# Patient Record
Sex: Female | Born: 1950 | Hispanic: No | Marital: Married | Smoking: Never smoker
Health system: Southern US, Community
[De-identification: ages and names within clinical notes are randomized; demographics above are authoritative.]

## PROBLEM LIST (undated history)

## (undated) DIAGNOSIS — N907 Vulvar cyst: Secondary | ICD-10-CM

## (undated) DIAGNOSIS — B9681 Helicobacter pylori [H. pylori] as the cause of diseases classified elsewhere: Secondary | ICD-10-CM

## (undated) DIAGNOSIS — E039 Hypothyroidism, unspecified: Secondary | ICD-10-CM

## (undated) DIAGNOSIS — Z842 Family history of other diseases of the genitourinary system: Secondary | ICD-10-CM

## (undated) DIAGNOSIS — M1990 Unspecified osteoarthritis, unspecified site: Secondary | ICD-10-CM

## (undated) DIAGNOSIS — C569 Malignant neoplasm of unspecified ovary: Secondary | ICD-10-CM

## (undated) DIAGNOSIS — K279 Peptic ulcer, site unspecified, unspecified as acute or chronic, without hemorrhage or perforation: Secondary | ICD-10-CM

## (undated) DIAGNOSIS — K633 Ulcer of intestine: Secondary | ICD-10-CM

## (undated) HISTORY — DX: Family history of other diseases of the genitourinary system: Z84.2

## (undated) HISTORY — PX: COLON SURGERY: SHX602

## (undated) HISTORY — DX: Unspecified osteoarthritis, unspecified site: M19.90

## (undated) HISTORY — PX: TOTAL ABDOMINAL HYSTERECTOMY W/ BILATERAL SALPINGOOPHORECTOMY: SHX83

## (undated) HISTORY — PX: FOOT SURGERY: SHX648

## (undated) HISTORY — DX: Malignant neoplasm of unspecified ovary: C56.9

## (undated) HISTORY — DX: Ulcer of intestine: K63.3

## (undated) HISTORY — PX: APPENDECTOMY: SHX54

## (undated) HISTORY — DX: Vulvar cyst: N90.7

---

## 2010-04-29 ENCOUNTER — Inpatient Hospital Stay: Payer: Self-pay | Admitting: Surgery

## 2012-11-13 ENCOUNTER — Emergency Department (HOSPITAL_COMMUNITY): Payer: Self-pay

## 2012-11-13 ENCOUNTER — Emergency Department (HOSPITAL_COMMUNITY)
Admission: EM | Admit: 2012-11-13 | Discharge: 2012-11-13 | Disposition: A | Discharge status: Home / Self Care | Payer: Self-pay | Attending: Emergency Medicine | Admitting: Emergency Medicine

## 2012-11-13 DIAGNOSIS — S51851A Open bite of right forearm, initial encounter: Secondary | ICD-10-CM

## 2012-11-13 DIAGNOSIS — S51809A Unspecified open wound of unspecified forearm, initial encounter: Secondary | ICD-10-CM | POA: Insufficient documentation

## 2012-11-13 DIAGNOSIS — IMO0002 Reserved for concepts with insufficient information to code with codable children: Secondary | ICD-10-CM | POA: Insufficient documentation

## 2012-11-13 DIAGNOSIS — Y929 Unspecified place or not applicable: Secondary | ICD-10-CM | POA: Insufficient documentation

## 2012-11-13 DIAGNOSIS — W540XXA Bitten by dog, initial encounter: Secondary | ICD-10-CM | POA: Insufficient documentation

## 2012-11-13 DIAGNOSIS — Y939 Activity, unspecified: Secondary | ICD-10-CM | POA: Insufficient documentation

## 2012-11-13 DIAGNOSIS — L03113 Cellulitis of right upper limb: Secondary | ICD-10-CM

## 2012-11-13 MED ORDER — AMOXICILLIN-POT CLAVULANATE 875-125 MG PO TABS: 1.0000 | ORAL_TABLET | Freq: Two times a day (BID) | ORAL | Status: DC

## 2012-11-13 MED ORDER — AMOXICILLIN-POT CLAVULANATE 875-125 MG PO TABS
1.0000 | ORAL_TABLET | Freq: Once | ORAL | Status: AC
  Administered 2012-11-13: 1 via ORAL
  Filled 2012-11-13: qty 1

## 2012-11-13 NOTE — ED Notes (Signed)
Another bite mark noted to right medial fa approx 1/4inch. No bleeding. Redness and slight swelling almost covers entire r posterior and some medial forarm. Radial pulses strong. Cap refil wnl to r hand. Pt rating pain 5. Pt was instructed that she will be going to xray and edp will be in after results all through translator line, translator number [phone]

## 2012-11-13 NOTE — ED Notes (Signed)
The pt requires [place] translation.

## 2012-11-13 NOTE — ED Provider Notes (Signed)
History  This chart was scribed for Charles B. Bernette Mayers, MD by Bennett Scrape, ED Scribe. This patient was seen in room APA03/APA03 and the patient's care was started at 12:17 PM.  CSN: [id number]  Arrival date & time 11/13/12  1129   First MD Initiated Contact with Patient 11/13/12 1217     Chief Complaint  Patient presents with  . Animal Bite    The history is provided by the patient. A language interpreter was used Conservation officer, historic buildings for [place] ).    HPI Comments: Angel Russell is a 62 y.o. female who presents to the Emergency Department complaining of gradually worsening, constant right forearm pain associated with redness, swelling and drainage described as pus that occurred yesterday. Pt reports that she was bitten by an Furniture conservator/restorer dog that had been vaccinated for rabies around 9 PM last night. She denies paresthesias in her right fingers as an associated symptom. Pain is minimal. No fever at home.  Pt does not have a h/o chronic medical conditions.   No past medical history on file.  No past surgical history on file.  No family history on file.  History  Substance Use Topics  . Smoking status: Not on file  . Smokeless tobacco: Not on file  . Alcohol Use: Not on file   No OB history provided.   Review of Systems  A complete 10 system review of systems was obtained and all systems are negative except as noted in the HPI and PMH.   Allergies  Review of patient's allergies indicates not on file.  Home Medications  No current outpatient prescriptions on file.  Triage Vitals: BP 116/57  Pulse 80  Temp(Src) 97.5 F (36.4 C) (Oral)  Resp 16  Ht 5\' 6"  (1.676 m)  Wt 224 lb (101.606 kg)  BMI 36.17 kg/m2  SpO2 98% Physical Exam  Nursing note and vitals reviewed. Constitutional: She is oriented to person, place, and time. She appears well-developed and well-nourished.  HENT:  Head: Normocephalic and atraumatic.  Neck: Neck supple.  Cardiovascular: Normal  rate.   Pulmonary/Chest: Effort normal.  Musculoskeletal: Normal range of motion.  Right forearm has three puncture wounds with moderate erythema and induration. No evidence of compartment syndrome  Neurological: She is alert and oriented to person, place, and time. No cranial nerve deficit.  Psychiatric: She has a normal mood and affect. Her behavior is normal.    ED Course  Procedures (including critical care time)  DIAGNOSTIC STUDIES: Oxygen Saturation is 98% on room air, normal by my interpretation.    COORDINATION OF CARE: 12:26 PM-Informed pt of radiology results. Discussed discharge plan which includes antibiotics with pt through translator and pt agreed to plan. Also advised pt to follow up in 2 days or sooner if infection spreads and pt agreed. Addressed symptoms to return for with pt.   Labs Reviewed - No data to display  Dg Forearm Right  11/13/2012   *RADIOLOGY REPORT*  Clinical Data: Dog bite  RIGHT FOREARM - 2 VIEW  Comparison: None.  Findings: Two views of the right forearm submitted.  No acute fracture or subluxation.  Mild soft tissue there is irregularity mid forearm. No radiopaque foreign body.  IMPRESSION: No acute fracture or subluxation.  No radiopaque foreign body.   Original Report Authenticated By: Natasha Mead, M.D.   1. Dog bite of right forearm   2. Cellulitis of right forearm     MDM  Dog bite to R forearm with subsequent cellulitis, xray neg.  Will give Augmentin, wound margins marked, plan discharge with close ED followup in 48hrs for wound check. Advised to return sooner for worsening pain, fever or signs of spreading infection.   I personally performed the services described in this documentation, which was scribed in my presence. The recorded information has been reviewed and is accurate.     Charles B. Bernette Mayers, MD 11/13/12 1259

## 2012-11-13 NOTE — ED Notes (Signed)
Translator line contacted again for d/c instruction and advised pt will be marking area with marker and wrapping area. Advised to come back to ED in 2 days unless redness spreads outside of marked area to come tomorrow. Pt verbalized understanding and had no questions.

## 2012-11-13 NOTE — ED Notes (Signed)
Friends with patient states that she was bitten by an Technical sales engineer that has been vaccinated for rabies yesterday at 9 pm.   States that the patient's arm looks worse today.  The pt does not speak english.

## 2015-11-17 ENCOUNTER — Encounter: Payer: Self-pay | Admitting: Nurse Practitioner

## 2015-11-17 ENCOUNTER — Ambulatory Visit (INDEPENDENT_AMBULATORY_CARE_PROVIDER_SITE_OTHER): Payer: Medicaid Other | Admitting: Nurse Practitioner

## 2015-11-17 VITALS — BP 114/80 | Ht 65.0 in | Wt 221.1 lb

## 2015-11-17 DIAGNOSIS — T07XXXA Unspecified multiple injuries, initial encounter: Secondary | ICD-10-CM

## 2015-11-17 DIAGNOSIS — Z13820 Encounter for screening for osteoporosis: Secondary | ICD-10-CM | POA: Diagnosis not present

## 2015-11-17 DIAGNOSIS — Z23 Encounter for immunization: Secondary | ICD-10-CM | POA: Diagnosis not present

## 2015-11-17 DIAGNOSIS — M21612 Bunion of left foot: Secondary | ICD-10-CM | POA: Diagnosis not present

## 2015-11-17 DIAGNOSIS — Z0000 Encounter for general adult medical examination without abnormal findings: Secondary | ICD-10-CM | POA: Diagnosis not present

## 2015-11-17 DIAGNOSIS — M19042 Primary osteoarthritis, left hand: Secondary | ICD-10-CM | POA: Diagnosis not present

## 2015-11-17 DIAGNOSIS — M2041 Other hammer toe(s) (acquired), right foot: Secondary | ICD-10-CM

## 2015-11-17 DIAGNOSIS — M2042 Other hammer toe(s) (acquired), left foot: Secondary | ICD-10-CM | POA: Diagnosis not present

## 2015-11-17 DIAGNOSIS — Z1231 Encounter for screening mammogram for malignant neoplasm of breast: Secondary | ICD-10-CM | POA: Diagnosis not present

## 2015-11-17 DIAGNOSIS — M19041 Primary osteoarthritis, right hand: Secondary | ICD-10-CM | POA: Diagnosis not present

## 2015-11-17 DIAGNOSIS — T148 Other injury of unspecified body region: Secondary | ICD-10-CM | POA: Diagnosis not present

## 2015-11-18 ENCOUNTER — Encounter: Payer: Self-pay | Admitting: Nurse Practitioner

## 2015-11-18 DIAGNOSIS — M19049 Primary osteoarthritis, unspecified hand: Secondary | ICD-10-CM | POA: Insufficient documentation

## 2015-11-18 DIAGNOSIS — M2042 Other hammer toe(s) (acquired), left foot: Secondary | ICD-10-CM

## 2015-11-18 DIAGNOSIS — M21612 Bunion of left foot: Secondary | ICD-10-CM | POA: Insufficient documentation

## 2015-11-18 DIAGNOSIS — M2041 Other hammer toe(s) (acquired), right foot: Secondary | ICD-10-CM | POA: Insufficient documentation

## 2015-11-18 NOTE — Progress Notes (Signed)
Subjective:    Patient ID: Angel Russell, female    DOB: [birth year], 65 y.o.   MRN: [id number]  HPI presents with her daughter for her wellness exam. Patient is [place] and speaks very minimal [place], her daughter is acting as Astronomer. Has had a hysterectomy and BSO a few years ago uncertain about exact year. Underwent chemotherapy at that time for ovarian cancer. Was told that her cancer was gone. Married, same sexual partner. No pelvic pain. Had a dental exam recently, is going to have several teeth removed. Last eye exam was 2 years ago. Healthy diet overall. Active lifestyle. Has stiffness and pain in her fingers at times. Colonoscopy is up-to-date last one was in 2012 which was normal according to patient. Has a chronic problem with both of her feet. Her daughter has some limited medical records today but they are written in [place].    Review of Systems  Constitutional: Negative for activity change, appetite change and fatigue.  HENT: Negative for dental problem, ear pain, sinus pressure and sore throat.   Eyes: Negative for visual disturbance.  Respiratory: Negative for cough, chest tightness, shortness of breath and wheezing.   Cardiovascular: Negative for chest pain.  Gastrointestinal: Negative for nausea, vomiting, abdominal pain, diarrhea, constipation, blood in stool and abdominal distention.  Genitourinary: Negative for dysuria, urgency, frequency, vaginal discharge, enuresis, difficulty urinating, genital sores and pelvic pain.  Musculoskeletal: Positive for arthralgias.       Objective:   Physical Exam  Constitutional: She is oriented to person, place, and time. She appears well-developed. No distress.  HENT:  Right Ear: External ear normal.  Left Ear: External ear normal.  Mouth/Throat: Oropharynx is clear and moist.  Poor dentition  Neck: Normal range of motion. Neck supple. No tracheal deviation present. No thyromegaly present.  Cardiovascular: Normal rate,  regular rhythm and normal heart sounds.  Exam reveals no gallop.   No murmur heard. Pulmonary/Chest: Effort normal and breath sounds normal.  Abdominal: Soft. She exhibits no distension. There is no tenderness.  Genitourinary: Vagina normal. No vaginal discharge found.  External GU normal. Vagina no discharge. Bimanual exam no masses or tenderness noted. Rectal exam no masses, no stool for Hemoccult.  Musculoskeletal: She exhibits no edema.  Very large bunion noted on the left great toe. Multiple hammertoes especially on the left foot with mild healing erosion on several toes.  Lymphadenopathy:    She has no cervical adenopathy.  Neurological: She is alert and oriented to person, place, and time.  Skin: Skin is warm and dry. No rash noted.  Psychiatric: She has a normal mood and affect. Her behavior is normal.  Vitals reviewed.  Breast exam: Slightly dense tissue, no obvious masses. Axilla no adenopathy.        Assessment & Plan:   Problem List Items Addressed This Visit      Musculoskeletal and Integument   Acquired bilateral hammer toes   Bunion of great toe of left foot   Degenerative joint disease of hand    Other Visit Diagnoses    Routine general medical examination at a health care facility    -  Primary    Relevant Orders    Lipid panel    Hepatic function panel    Basic metabolic panel    CBC with Differential/Platelet    TSH    POC Hemoccult Bld/Stl (3-Cd Home Screen)    Abrasions of multiple sites        Relevant Orders  Lipid panel    Hepatic function panel    Basic metabolic panel    CBC with Differential/Platelet    TSH    Td vaccine greater than or equal to 7yo preservative free IM (Completed)    Visit for screening mammogram        Relevant Orders    MM SCREENING BREAST TOMO BILATERAL    Screening for osteoporosis        Relevant Orders    DG Bone Density    Need for vaccination        Relevant Orders    Pneumococcal polysaccharide vaccine  23-valent greater than or equal to 2yo subcutaneous/IM (Completed)      Will refer to podiatry for evaluation of her feet. Lab work pending. Also mammogram and bone density study. Explained immunization schedule. Given prescription for Zostavax. Plan Prevnar 13 at next physical in 1 year. Recommend activity healthy diet and daily vitamin D and calcium supplementation.

## 2015-11-19 LAB — BASIC METABOLIC PANEL
BUN/Creatinine Ratio: 18 (ref 12–28)
BUN: 16 mg/dL (ref 8–27)
CALCIUM: 9.3 mg/dL (ref 8.7–10.3)
CO2: 23 mmol/L (ref 18–29)
CREATININE: 0.88 mg/dL (ref 0.57–1.00)
Chloride: 104 mmol/L (ref 96–106)
GFR, EST AFRICAN AMERICAN: 80 mL/min/{1.73_m2} (ref >59)
GFR, EST NON AFRICAN AMERICAN: 69 mL/min/{1.73_m2} (ref >59)
Glucose: 83 mg/dL (ref 65–99)
POTASSIUM: 4.2 mmol/L (ref 3.5–5.2)
Sodium: 141 mmol/L (ref 134–144)

## 2015-11-19 LAB — LIPID PANEL
CHOL/HDL RATIO: 3.7 ratio (ref 0.0–4.4)
Cholesterol, Total: 223 mg/dL — ABNORMAL HIGH (ref 100–199)
HDL: 61 mg/dL (ref >39)
LDL CALC: 136 mg/dL — AB (ref 0–99)
Triglycerides: 130 mg/dL (ref 0–149)
VLDL CHOLESTEROL CAL: 26 mg/dL (ref 5–40)

## 2015-11-19 LAB — HEPATIC FUNCTION PANEL
ALBUMIN: 4.1 g/dL (ref 3.6–4.8)
ALT: 38 IU/L — ABNORMAL HIGH (ref 0–32)
AST: 24 IU/L (ref 0–40)
Alkaline Phosphatase: 88 IU/L (ref 39–117)
BILIRUBIN TOTAL: 0.5 mg/dL (ref 0.0–1.2)
Bilirubin, Direct: 0.13 mg/dL (ref 0.00–0.40)
TOTAL PROTEIN: 6.8 g/dL (ref 6.0–8.5)

## 2015-11-19 LAB — CBC WITH DIFFERENTIAL/PLATELET
BASOS: 0 %
Basophils Absolute: 0 10*3/uL (ref 0.0–0.2)
EOS (ABSOLUTE): 0.2 10*3/uL (ref 0.0–0.4)
Eos: 3 %
HEMATOCRIT: 40.7 % (ref 34.0–46.6)
Hemoglobin: 13.5 g/dL (ref 11.1–15.9)
Immature Grans (Abs): 0 10*3/uL (ref 0.0–0.1)
Immature Granulocytes: 0 %
LYMPHS: 23 %
Lymphocytes Absolute: 1.2 10*3/uL (ref 0.7–3.1)
MCH: 29.7 pg (ref 26.6–33.0)
MCHC: 33.2 g/dL (ref 31.5–35.7)
MCV: 90 fL (ref 79–97)
MONOCYTES: 8 %
MONOS ABS: 0.4 10*3/uL (ref 0.1–0.9)
NEUTROS PCT: 66 %
Neutrophils Absolute: 3.5 10*3/uL (ref 1.4–7.0)
Platelets: 286 10*3/uL (ref 150–379)
RBC: 4.54 x10E6/uL (ref 3.77–5.28)
RDW: 14.2 % (ref 12.3–15.4)
WBC: 5.3 10*3/uL (ref 3.4–10.8)

## 2015-11-19 LAB — TSH: TSH: 4.33 u[IU]/mL (ref 0.450–4.500)

## 2015-11-23 ENCOUNTER — Encounter: Payer: Self-pay | Admitting: Family Medicine

## 2015-11-25 ENCOUNTER — Other Ambulatory Visit (HOSPITAL_COMMUNITY): Payer: Self-pay

## 2015-11-25 ENCOUNTER — Ambulatory Visit (HOSPITAL_COMMUNITY): Payer: Self-pay

## 2015-12-02 ENCOUNTER — Ambulatory Visit (HOSPITAL_COMMUNITY)
Admission: RE | Admit: 2015-12-02 | Discharge: 2015-12-02 | Disposition: A | Discharge status: Home / Self Care | Payer: Medicaid Other | Source: Ambulatory Visit | Attending: Nurse Practitioner | Admitting: Nurse Practitioner

## 2015-12-02 ENCOUNTER — Encounter (HOSPITAL_COMMUNITY): Payer: Self-pay

## 2015-12-02 ENCOUNTER — Encounter: Payer: Self-pay | Admitting: Nurse Practitioner

## 2015-12-02 ENCOUNTER — Other Ambulatory Visit: Payer: Self-pay | Admitting: Nurse Practitioner

## 2015-12-02 DIAGNOSIS — Z1231 Encounter for screening mammogram for malignant neoplasm of breast: Secondary | ICD-10-CM

## 2015-12-02 DIAGNOSIS — Z13820 Encounter for screening for osteoporosis: Secondary | ICD-10-CM | POA: Insufficient documentation

## 2015-12-12 ENCOUNTER — Encounter: Payer: Self-pay | Admitting: Nurse Practitioner

## 2015-12-14 ENCOUNTER — Encounter: Payer: Self-pay | Admitting: Podiatry

## 2015-12-14 ENCOUNTER — Ambulatory Visit (INDEPENDENT_AMBULATORY_CARE_PROVIDER_SITE_OTHER): Payer: Medicaid Other

## 2015-12-14 ENCOUNTER — Ambulatory Visit (INDEPENDENT_AMBULATORY_CARE_PROVIDER_SITE_OTHER): Payer: Medicaid Other | Admitting: Podiatry

## 2015-12-14 VITALS — BP 124/88 | HR 70 | Resp 16 | Ht 66.0 in | Wt 220.0 lb

## 2015-12-14 DIAGNOSIS — M21612 Bunion of left foot: Secondary | ICD-10-CM

## 2015-12-14 DIAGNOSIS — M79671 Pain in right foot: Secondary | ICD-10-CM | POA: Diagnosis not present

## 2015-12-14 DIAGNOSIS — M2040 Other hammer toe(s) (acquired), unspecified foot: Secondary | ICD-10-CM | POA: Diagnosis not present

## 2015-12-14 DIAGNOSIS — M19079 Primary osteoarthritis, unspecified ankle and foot: Secondary | ICD-10-CM

## 2015-12-14 DIAGNOSIS — M79672 Pain in left foot: Principal | ICD-10-CM

## 2015-12-14 DIAGNOSIS — M129 Arthropathy, unspecified: Secondary | ICD-10-CM

## 2015-12-14 DIAGNOSIS — M21611 Bunion of right foot: Secondary | ICD-10-CM

## 2015-12-14 NOTE — Progress Notes (Signed)
   Subjective:    Patient ID: Angel Russell, female    DOB: [birth year], 64 y.o.   MRN: [id number]  HPI Chief Complaint  Patient presents with  . Foot Pain    Bilateral; bunions; pt stated, "weather changes affects feet; when walks a lot, feet hurt; hurts to bend toes; toes get numb"  . Callouses    Bilateral; great toe-medial      Review of Systems  All other systems reviewed and are negative.      Objective:   Physical Exam        Assessment & Plan:

## 2015-12-15 NOTE — Progress Notes (Signed)
Subjective:     Patient ID: Angel Russell, female   DOB: [birth year], 65 y.o.   MRN: [id number]  HPI patient presents with structural deformity right over left first metatarsal that's painful and also digital deformity she states that she's tried to wear wider shoes she's tried soaks and pads without relief of symptoms   Review of Systems  All other systems reviewed and are negative.      Objective:   Physical Exam  Constitutional: She is oriented to person, place, and time.  Cardiovascular: Intact distal pulses.   Musculoskeletal: Normal range of motion.  Neurological: She is oriented to person, place, and time.  Skin: Skin is warm.  Nursing note and vitals reviewed.  neurovascular status intact muscle strength adequate range of motion within normal limits with patient found to have hyperostosis medial aspect first metatarsal head right over left with redness and pain when palpated and digital deformities of the lesser toes bilateral. 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 Amalia Hailey and he is going to take over her care and see her back for consideration of surgical bunion correction and digital procedures. We reviewed this case together and he will see her back in September  X-rays indicate structural deformity with digital and bunion deformity noted

## 2016-01-23 ENCOUNTER — Ambulatory Visit (INDEPENDENT_AMBULATORY_CARE_PROVIDER_SITE_OTHER): Payer: Medicaid Other | Admitting: Podiatry

## 2016-01-23 DIAGNOSIS — M21612 Bunion of left foot: Secondary | ICD-10-CM

## 2016-01-23 DIAGNOSIS — M21611 Bunion of right foot: Secondary | ICD-10-CM

## 2016-01-23 DIAGNOSIS — M19079 Primary osteoarthritis, unspecified ankle and foot: Secondary | ICD-10-CM

## 2016-01-23 DIAGNOSIS — M216X9 Other acquired deformities of unspecified foot: Secondary | ICD-10-CM | POA: Diagnosis not present

## 2016-01-23 DIAGNOSIS — M2040 Other hammer toe(s) (acquired), unspecified foot: Secondary | ICD-10-CM | POA: Diagnosis not present

## 2016-01-23 DIAGNOSIS — M79671 Pain in right foot: Secondary | ICD-10-CM

## 2016-01-23 DIAGNOSIS — M79672 Pain in left foot: Principal | ICD-10-CM

## 2016-01-23 MED ORDER — BACLOFEN 10 MG PO TABS: 5.0000 mg | ORAL_TABLET | Freq: Two times a day (BID) | ORAL | 0 refills | Status: DC

## 2016-01-23 NOTE — Progress Notes (Addendum)
Subjective: Patient presents today with her daughter for surgical consult. Patient has severe bunion deformities and hammertoe contractures which she states are very painful. Patient presents today for surgical options and management. Patient states that she is unable ambulate in shoe gear and when she walks she does get significant amounts of pain related to the bunion hammertoe deformities bilaterally. She does state the left foot is more painful than the right. Patient also states that she does have some sciatic type pain to the left buttocks and thigh as well as the right calf muscle.   Objective: Physical Exam General: The patient is alert and oriented x3 in no acute distress.  Dermatology: Skin is warm, dry and supple bilateral lower extremities. Negative for open lesions or macerations.  Vascular: Palpable pedal pulses bilaterally. No edema or erythema noted. Capillary refill within normal limits.  Neurological: Epicritic and protective threshold grossly intact bilaterally.   Musculoskeletal Exam:  Pain on palpation to the medial aspect of the ankle right overlying the deltoid ligament. Moderate pain with range of motion to the first MPJ bilaterally. Hammertoe digit deformities digits 2, 3 and 5 with MPJ contractures left foot as well as to 3 and 5 of the right foot. Adductovarus deformity specifically noted to the fifth digit left foot. Range of motion within normal limits to all pedal and ankle joints bilateral. Muscle strength 5/5 in all groups bilateral.   Radiographic Exam:  Hallux abductovalgus deformity with an increased intermetatarsal angle approximately 20.  Normal osseous mineralization. Joint spaces preserved. No fracture/dislocation/boney destruction.     Assessment: #1 hallux abductovalgus deformity bilateral #2 rigid hammertoe contracture digits 23 bilateral #3 adductovarus digit deformity digit #5 left foot #4 pain in bilateral feet #5 posterior tibial tendinitis  function with deltoid ligament sprain right ankle #6 sciatica left lower extremity Problem List Items Addressed This Visit    None    Visit Diagnoses    Foot pain, bilateral    -  Primary   Bilateral bunions       Hammertoe, unspecified laterality       Arthritis of foot            Plan of Care:  #1 Patient was evaluated. #2 Discussed conservative and surgical management for the patient. The patient opted for surgical management. #3 really want to do surgery on the left foot since his more symptomatic at this time. Surgery will consist of bunionectomy with hammertoe repair digits 23 and 5 left foot. Discussed in detail all occasions and details of the procedure. #4 postoperative shoe was dispensed for after surgery. #5 prescription for Flexeril given for nightly symptoms of cramping and possible restless leg syndrome. #6 patient is going to try an ankle brace that she has at home for her right ankle pain and PTT D. #7 patient history turned to the clinic 1 week after surgery. #8 cam boot was dispensed for the patient to be worn postoperatively. Patient instructed to bring the cam boot with her to the surgery.     Dr. Edrick Kins, [place]

## 2016-01-23 NOTE — Patient Instructions (Signed)

## 2016-01-25 ENCOUNTER — Other Ambulatory Visit: Payer: Self-pay

## 2016-01-25 DIAGNOSIS — M79671 Pain in right foot: Secondary | ICD-10-CM

## 2016-01-25 DIAGNOSIS — M79672 Pain in left foot: Principal | ICD-10-CM

## 2016-01-25 DIAGNOSIS — M19079 Primary osteoarthritis, unspecified ankle and foot: Secondary | ICD-10-CM

## 2016-01-25 DIAGNOSIS — M5416 Radiculopathy, lumbar region: Secondary | ICD-10-CM

## 2016-01-25 DIAGNOSIS — M25552 Pain in left hip: Secondary | ICD-10-CM

## 2016-01-25 DIAGNOSIS — M5430 Sciatica, unspecified side: Secondary | ICD-10-CM

## 2016-02-13 ENCOUNTER — Telehealth: Payer: Self-pay | Admitting: *Deleted

## 2016-02-13 NOTE — Telephone Encounter (Signed)
Female caller states she is the dtr of pt and had questions about pt's surgery.

## 2016-02-16 NOTE — Telephone Encounter (Signed)
I attempted to call patient's daughter.  I left her a message to call me back.

## 2016-03-20 ENCOUNTER — Telehealth: Payer: Self-pay | Admitting: *Deleted

## 2016-03-20 NOTE — Telephone Encounter (Signed)
"  I would like to schedule surgery for my mom.  I had to cancel it before because I was sick."  Do you have a date in mind for her to have the surgery?  "Yes, sometime this month."  He can do it November 30.  "That date will be perfect.  If I bring her to surgery, she will not need an interpreter.  If my friend brings her she will need an interpreter."  Okay, I will make note of it.

## 2016-04-09 ENCOUNTER — Telehealth: Payer: Self-pay | Admitting: *Deleted

## 2016-04-09 NOTE — Telephone Encounter (Signed)
"  The surgical center told me to call you.  My mom has been having pain in her hip and has been taking Ibuprofen.  Is it okay for her to have the surgery on Thursday?"  It should be okay but tell her not to take anymore.  If Dr. Amalia Hailey tells me different, I will give you a call.  She also got a shoe when she was there.  Her insurance will not cover it.  The lady in billing told me, we can bring it back as long as it had not been worn.  My friend had one so she is going to use it."  It's okay for you to bring it back.

## 2016-04-12 ENCOUNTER — Encounter: Payer: Self-pay | Admitting: Podiatry

## 2016-04-12 DIAGNOSIS — M2042 Other hammer toe(s) (acquired), left foot: Secondary | ICD-10-CM | POA: Diagnosis not present

## 2016-04-12 DIAGNOSIS — M2012 Hallux valgus (acquired), left foot: Secondary | ICD-10-CM | POA: Diagnosis not present

## 2016-04-12 DIAGNOSIS — M7752 Other enthesopathy of left foot: Secondary | ICD-10-CM | POA: Diagnosis not present

## 2016-04-18 ENCOUNTER — Ambulatory Visit (INDEPENDENT_AMBULATORY_CARE_PROVIDER_SITE_OTHER): Payer: Medicaid Other

## 2016-04-18 ENCOUNTER — Ambulatory Visit (INDEPENDENT_AMBULATORY_CARE_PROVIDER_SITE_OTHER): Payer: Medicaid Other | Admitting: Podiatry

## 2016-04-18 DIAGNOSIS — Z9889 Other specified postprocedural states: Secondary | ICD-10-CM

## 2016-04-18 DIAGNOSIS — M2012 Hallux valgus (acquired), left foot: Secondary | ICD-10-CM | POA: Diagnosis not present

## 2016-04-18 NOTE — Progress Notes (Signed)
Subjective: Patient presents today status post forefoot reconstructive surgery of the left foot. Patient states that she's feeling very well. Date of surgery 04/12/2016. Patient states that she does have minimal discomfort however she does believe she is been walking too much on her foot.  Objective: Skin incisions are well coapted. Staples intact. Pins intact. There is a moderate amount of edema noted with tension along the incision sites.  Assessment: Status post bunionectomy with hammertoe correction digits 2-5 left foot. Date of surgery 04/12/2016. Doing well.  Plan of care: Patient was evaluated today. Dressings were changed. Return to clinic in 1 week for dressing change only.  Skin staples will be removed at week 3 postop. Percutaneous pins will be removed at week 5 postop.

## 2016-04-23 ENCOUNTER — Ambulatory Visit (INDEPENDENT_AMBULATORY_CARE_PROVIDER_SITE_OTHER): Payer: Medicaid Other | Admitting: Podiatry

## 2016-04-23 DIAGNOSIS — Z9889 Other specified postprocedural states: Secondary | ICD-10-CM

## 2016-04-23 MED ORDER — SULFAMETHOXAZOLE-TRIMETHOPRIM 800-160 MG PO TABS: 1.0000 | ORAL_TABLET | Freq: Two times a day (BID) | ORAL | 0 refills | Status: DC

## 2016-04-23 MED ORDER — GENTAMICIN SULFATE 0.1 % EX CREA: 1.0000 "application " | TOPICAL_CREAM | Freq: Three times a day (TID) | CUTANEOUS | 0 refills | Status: DC

## 2016-04-24 ENCOUNTER — Telehealth: Payer: Self-pay | Admitting: *Deleted

## 2016-04-24 NOTE — Telephone Encounter (Addendum)
-----   Message from Edrick Kins, DPM sent at 04/23/2016 10:50 AM EST ----- Regarding: [place] Please provide home health dressing changes every other day x 4 weeks. Patient is homebound.   - cleanse with normal saline or wound cleanser.  - apply gentamicin cream over all incision sites and percutaneous pins.  - dress with nonadherant telfa, 4x4 gauze, light ace wrap.  - reapply cam boot or postop shoe (patient preference)  Dx : status post reconstructive forefoot surgery left foot. DOS: 04/12/16.   Dr. Amalia Hailey. 04/24/2016-Faxed copy of 04/23/2016 orders to [facility]. Ms. Eudelia Bunch - [facility] states she received the email from Dr. Amalia Hailey and will start home health care next week.

## 2016-04-25 ENCOUNTER — Encounter: Payer: Medicaid Other | Admitting: Podiatry

## 2016-04-29 NOTE — Progress Notes (Signed)
Subjective: Patient presents today status post forefoot reconstructive surgery of the left foot. Patient states that she's feeling very well. Date of surgery 04/12/2016. Patient states that she does have minimal discomfort however she does believe she is been walking too much on her foot.  Objective: Skin incisions are well coapted. Staples intact. Pins intact. There is a moderate amount of edema noted with tension along the incision sites.  Assessment: Status post bunionectomy with hammertoe correction digits 2-5 left foot. Date of surgery 04/12/2016. Doing well.  Plan of care: Patient was evaluated today. Sutures were removed from the hammertoe incision sites. Bunion sutures were left intact. Home health care orders were placed every other day Prescription for gentamicin cream Prescription for doxycycline 10 days Return to clinic in 1 week for bunion suture removal  Skin staples will be removed at week 3 postop. Percutaneous pins will be removed at week 5 postop.

## 2016-04-30 ENCOUNTER — Ambulatory Visit (INDEPENDENT_AMBULATORY_CARE_PROVIDER_SITE_OTHER): Payer: Medicaid Other | Admitting: Podiatry

## 2016-04-30 ENCOUNTER — Encounter: Payer: Self-pay | Admitting: Podiatry

## 2016-04-30 DIAGNOSIS — Z9889 Other specified postprocedural states: Secondary | ICD-10-CM

## 2016-04-30 NOTE — Progress Notes (Signed)
Subjective: Patient presents today status post forefoot reconstructive surgery of the left foot. Patient states that she's feeling very well.. Date of surgery 04/12/2016. Patient does have swelling sensations at night time. She states that she has been elevating her foot and decreasing her walking significantly his past week.   Objective: Staples are intact. Moderate amount of edema noted throughout the entire forefoot of the left lower extremity. Minimal dehiscence noted to the hammertoe incision site as well as the bunionectomy site of the left foot.  Assessment: Status post bunionectomy with hammertoe correction digits 2-5 left foot. Date of surgery 04/12/2016. Moderate edema with minimal wound dehiscence.   Plan of care: Patient was evaluated today. Staples were removed from the bunion site. Quarter-inch Steri-Strips applied. Continue home health dressing changes. Return to clinic in 2 weeks for percutaneous pin removal.

## 2016-05-01 NOTE — Telephone Encounter (Signed)
-----   Message from Emmit Pomfret sent at 04/30/2016 10:23 AM EST ----- Regarding: RE: Angel Russell Orders We admitted her to home health on 12/12 so shes all set.Marland Kitchen  Happy Holidays!    ----- Message ----- From: Edrick Kins, DPM Sent: 04/29/2016  11:22 AM To: Andres Ege, RN, Emmit Pomfret Subject: Home Health Dressing Change Orders             Not sure I sent this message already, disregard if I already sent this message regarding this patient....  Please provide home health dressing changes 3x/week x 4 weeks. Patient is homebound.  Dx : Status post forefoot reconstructive surgery left foot.  DOS: 04/12/2016.  #1 cleanse wound with normal saline or wound cleanser. Dry. #2 applied gentamicin cream to all incision sites and percutaneous pinhole sites. #3 dressed with nonadherent gauze (telfa), 4x4 gauze, large Kerlix, Ace wrap #4 reapply boot or postoperative shoe (patient preference).   Dr. Amalia Hailey

## 2016-05-02 NOTE — Progress Notes (Signed)
DOS 11.30.2017 Bunionectomy with Osteotomy Left Foot. Hammertoe Repair Digits Two, Three, and Five Left Foot. Any other indicated procedure.

## 2016-05-17 ENCOUNTER — Ambulatory Visit (INDEPENDENT_AMBULATORY_CARE_PROVIDER_SITE_OTHER): Payer: Medicaid Other

## 2016-05-17 ENCOUNTER — Ambulatory Visit (INDEPENDENT_AMBULATORY_CARE_PROVIDER_SITE_OTHER): Payer: Self-pay | Admitting: Podiatry

## 2016-05-17 DIAGNOSIS — Z9889 Other specified postprocedural states: Secondary | ICD-10-CM

## 2016-05-17 DIAGNOSIS — M2012 Hallux valgus (acquired), left foot: Secondary | ICD-10-CM | POA: Diagnosis not present

## 2016-05-18 ENCOUNTER — Ambulatory Visit: Payer: Medicaid Other | Admitting: Podiatry

## 2016-05-20 NOTE — Progress Notes (Signed)
Subjective: Patient presents today for follow-up evaluation of a forefoot reconstruction to the left foot. Date of surgery 04/12/2016. Patient states that she experiences pain at night.  Objective: Skin incisions are well coapted with exception of a small wound dehiscence to the incision site overlying the first MPJ right foot measuring [phone] cm (LxWxD).  Dehiscence site is granular with healthy wound base. No sign of infectious process significant erythema to the left lower extremity. Moderate edema noted left foot.  Assessment: Status post reconstructive forefoot surgery left foot. Date of surgery 04/12/2016.  Plan of care: #1 patient was evaluated #2 all sutures were removed. #3 Prisma collagen dressing was applied to the wound dehiscence site followed by dry sterile dressing. For return to clinic in 1 week  Patient may begin showering in 1 week.  Edrick Kins, DPM [facility]  Dr. Edrick Kins, [address]                                        [place], [place] [address]                Office [phone]  Fax [phone]

## 2016-05-24 ENCOUNTER — Ambulatory Visit (INDEPENDENT_AMBULATORY_CARE_PROVIDER_SITE_OTHER): Payer: Medicaid Other

## 2016-05-24 ENCOUNTER — Ambulatory Visit (INDEPENDENT_AMBULATORY_CARE_PROVIDER_SITE_OTHER): Payer: Medicaid Other | Admitting: Podiatry

## 2016-05-24 DIAGNOSIS — Z9889 Other specified postprocedural states: Secondary | ICD-10-CM

## 2016-05-24 DIAGNOSIS — M2012 Hallux valgus (acquired), left foot: Secondary | ICD-10-CM | POA: Diagnosis not present

## 2016-05-30 NOTE — Progress Notes (Signed)
Subjective: Patient presents today for follow-up evaluation of a forefoot reconstruction to the left foot. Date of surgery 04/12/2016. Patient states that she still has intermittent pain  Objective: Skin incisions are well coapted with exception of a small wound dehiscence to the incision site overlying the first MPJ right foot measuring [phone] cm (LxWxD).  Dehiscence site is granular with healthy wound base. No sign of infectious process significant erythema to the left lower extremity. Moderate edema noted left foot.  Assessment: Status post reconstructive forefoot surgery left foot. Date of surgery 04/12/2016.  Plan of care: #1 patient was evaluated #2 antibiotic ointment and a Band-Aid was applied today #3 recommend daily application of July antibiotic ointment and a Band-Aid #4 return to clinic in 4 weeks Patient may begin showering   Edrick Kins, DPM [facility]  Dr. Edrick Kins, [address]                                        [place], [place] [address]                Office [phone]  Fax [phone]

## 2016-06-21 ENCOUNTER — Ambulatory Visit: Payer: Medicaid Other | Admitting: Podiatry

## 2016-07-02 ENCOUNTER — Ambulatory Visit: Payer: Medicaid Other | Admitting: Podiatry

## 2016-07-09 ENCOUNTER — Ambulatory Visit (INDEPENDENT_AMBULATORY_CARE_PROVIDER_SITE_OTHER): Payer: Self-pay | Admitting: Podiatry

## 2016-07-09 DIAGNOSIS — M2012 Hallux valgus (acquired), left foot: Secondary | ICD-10-CM

## 2016-07-09 DIAGNOSIS — Z9889 Other specified postprocedural states: Secondary | ICD-10-CM

## 2016-07-15 NOTE — Progress Notes (Signed)
Subjective: Patient presents today for follow-up evaluation of a forefoot reconstruction to the left foot. Date of surgery 04/12/2016. Today an interpreter was provided. Patient states that she does have some minimal soreness and swelling. Otherwise patient is doing very well.  Objective: Skin incisions are well coapted. minimal edema noted to the left foot.  Assessment: Status post reconstructive forefoot surgery left foot. Date of surgery 04/12/2016.  Plan of care: #1 patient was evaluated #2 continued compressive ankle sleeve. #3 recommend good shoe gear #4 return to clinic when necessary  Edrick Kins, DPM [facility]  Dr. Edrick Kins, [address]                                        [place], [place] [address]                Office [phone]  Fax [phone]

## 2016-10-19 ENCOUNTER — Ambulatory Visit: Payer: Medicaid Other | Admitting: Nurse Practitioner

## 2016-11-02 ENCOUNTER — Ambulatory Visit (INDEPENDENT_AMBULATORY_CARE_PROVIDER_SITE_OTHER): Payer: Medicaid Other | Admitting: Nurse Practitioner

## 2016-11-02 ENCOUNTER — Encounter: Payer: Self-pay | Admitting: Nurse Practitioner

## 2016-11-02 VITALS — BP 108/72 | Ht 65.0 in | Wt 220.0 lb

## 2016-11-02 DIAGNOSIS — M19042 Primary osteoarthritis, left hand: Secondary | ICD-10-CM

## 2016-11-02 DIAGNOSIS — M19041 Primary osteoarthritis, right hand: Secondary | ICD-10-CM

## 2016-11-02 DIAGNOSIS — M25552 Pain in left hip: Secondary | ICD-10-CM

## 2016-11-02 MED ORDER — NABUMETONE 750 MG PO TABS: 750.0000 mg | ORAL_TABLET | Freq: Two times a day (BID) | ORAL | 2 refills | Status: DC | PRN

## 2016-11-03 ENCOUNTER — Encounter: Payer: Self-pay | Admitting: Nurse Practitioner

## 2016-11-03 NOTE — Progress Notes (Signed)
Subjective:  Presents with her daughter and another interpreter; speaks very limited [place]. C/o migratory joint pain, more lately in the left hip. No history of injury. No fevers. Had recent left foot surgery for bunions and hammer toes. Was started on Nabumetone for pain which also resolved her other joint pain. Ran out of this medicine. Tried Ibuprofen but his caused stomach upset and reflux.   Objective:   BP 108/72   Ht 5\' 5"  (1.651 m)   Wt 220 lb (99.8 kg)   BMI 36.61 kg/m  NAD. Alert, oriented. Lungs clear. Heart RRR. Abdomen soft, non tender. SLR neg bilat. Tenderness with palpation of left hip. Slightly limited ROM with tenderness noted with rotation of hip. Severe arthritic changes noted in both hands.   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 ibuprofen (ADVIL,MOTRIN) 200 MG tablet    Sig: Take 200 mg by mouth every 4 (four) hours as needed.  . nabumetone (RELAFEN) 750 MG tablet    Sig: Take 1 tablet (750 mg total) by mouth 2 (two) times daily as needed.    Dispense:  60 tablet    Refill:  2    Order Specific Question:   Supervising Provider    Answer:   Mikey Kirschner [[id number]]   Hold on Ibuprofen. Restart Relafen prn with food. DC and call if any stomach upset. Call back in 2 weeks if hip pain persists, plan xray at that time. Patient and her daughter verbalize understanding. Recommend yearly PE.

## 2016-11-06 ENCOUNTER — Other Ambulatory Visit: Payer: Self-pay | Admitting: Nurse Practitioner

## 2016-11-06 ENCOUNTER — Telehealth: Payer: Self-pay | Admitting: *Deleted

## 2016-11-06 MED ORDER — MELOXICAM 15 MG PO TABS: 15.0000 mg | ORAL_TABLET | Freq: Every day | ORAL | 2 refills | Status: DC

## 2016-11-06 NOTE — Telephone Encounter (Signed)
Nabumetone 750 mg is non preferred under Medicaid-Naproxen is preferred.  Please advise?

## 2016-11-06 NOTE — Telephone Encounter (Signed)
Spoke with her daughter. Switched med and sent in to [facility]. Stop med and call if GI upset.

## 2016-12-07 ENCOUNTER — Encounter: Payer: Medicaid Other | Admitting: Nurse Practitioner

## 2016-12-21 ENCOUNTER — Encounter: Payer: Self-pay | Admitting: Nurse Practitioner

## 2016-12-21 ENCOUNTER — Ambulatory Visit (INDEPENDENT_AMBULATORY_CARE_PROVIDER_SITE_OTHER): Payer: Medicaid Other | Admitting: Nurse Practitioner

## 2016-12-21 VITALS — BP 120/80 | Ht 65.0 in | Wt 221.2 lb

## 2016-12-21 DIAGNOSIS — Z0000 Encounter for general adult medical examination without abnormal findings: Secondary | ICD-10-CM

## 2016-12-21 DIAGNOSIS — Z1231 Encounter for screening mammogram for malignant neoplasm of breast: Secondary | ICD-10-CM | POA: Diagnosis not present

## 2016-12-21 DIAGNOSIS — Z23 Encounter for immunization: Secondary | ICD-10-CM

## 2016-12-21 DIAGNOSIS — N907 Vulvar cyst: Secondary | ICD-10-CM

## 2016-12-21 DIAGNOSIS — Z01419 Encounter for gynecological examination (general) (routine) without abnormal findings: Secondary | ICD-10-CM

## 2016-12-21 NOTE — Progress Notes (Signed)
   Subjective:    Patient ID: Angel Russell, female    DOB: [birth year], 66 y.o.   MRN: [id number]  HPI presents for her wellness exam. Interpreter is present since patient does not speak [place]. Has had a hysterectomy with BSO. No new sexual partners. No pelvic pain. Needs a vision exam. Has had dental care. Very active lifestyle.    Review of Systems  Constitutional: Negative for activity change, appetite change and fatigue.  HENT: Negative for dental problem, ear pain, sinus pressure and sore throat.   Respiratory: Negative for cough, chest tightness, shortness of breath and wheezing.   Cardiovascular: Negative for chest pain.  Gastrointestinal: Negative for abdominal distention, abdominal pain, constipation, diarrhea, nausea and vomiting.  Genitourinary: Negative for difficulty urinating, dysuria, enuresis, frequency, genital sores, pelvic pain, urgency and vaginal discharge.       Objective:   Physical Exam  Constitutional: She is oriented to person, place, and time. She appears well-developed. No distress.  HENT:  Right Ear: External ear normal.  Left Ear: External ear normal.  Mouth/Throat: Oropharynx is clear and moist.  Neck: Normal range of motion. Neck supple. No tracheal deviation present. No thyromegaly present.  Cardiovascular: Normal rate, regular rhythm and normal heart sounds.  Exam reveals no gallop.   No murmur heard. Pulmonary/Chest: Effort normal and breath sounds normal.  Abdominal: Soft. She exhibits no distension. There is no tenderness.  Genitourinary: Vagina normal. No vaginal discharge found.  Genitourinary Comments: External GU: superficial labial cyst mid right labia. Non tender with no erythema. Vagina: no discharge. Bimanual exam: no tenderness or masses.   Lymphadenopathy:    She has no cervical adenopathy.  Neurological: She is alert and oriented to person, place, and time.  Skin: Skin is warm and dry. No rash noted.  Psychiatric: She has a normal  mood and affect. Her behavior is normal.  Vitals reviewed. Breast exam: no masses; axillae no adenopathy.         Assessment & Plan:  Well woman exam - Plan: Lipid panel, Hepatic function panel, Basic metabolic panel  Screening mammogram, encounter for - Plan: MM SCREENING BREAST TOMO BILATERAL, CANCELED: MM DIGITAL SCREENING BILATERAL  Need for vaccination - Plan: CANCELED: Pneumococcal conjugate vaccine 13-valent IM  Labial cyst  Taking a daily MVI. Labs pending. Recommend eye exam.  Return in about 1 year (around 12/21/2017) for physical.

## 2016-12-21 NOTE — Patient Instructions (Addendum)
Needs routine eye exam Given Rx for new shingles vaccine; recommend even though she has had Zostavax since it is a better vaccine Take Rx to her pharmacy and check on costs first; if free or not too expensive recommend she get this

## 2016-12-22 LAB — BASIC METABOLIC PANEL
BUN / CREAT RATIO: 21 (ref 12–28)
BUN: 17 mg/dL (ref 8–27)
CO2: 25 mmol/L (ref 20–29)
Calcium: 9.4 mg/dL (ref 8.7–10.3)
Chloride: 105 mmol/L (ref 96–106)
Creatinine, Ser: 0.82 mg/dL (ref 0.57–1.00)
GFR, EST AFRICAN AMERICAN: 86 mL/min/{1.73_m2} (ref >59)
GFR, EST NON AFRICAN AMERICAN: 75 mL/min/{1.73_m2} (ref >59)
Glucose: 89 mg/dL (ref 65–99)
POTASSIUM: 4.8 mmol/L (ref 3.5–5.2)
SODIUM: 142 mmol/L (ref 134–144)

## 2016-12-22 LAB — LIPID PANEL
CHOLESTEROL TOTAL: 226 mg/dL — AB (ref 100–199)
Chol/HDL Ratio: 3.6 ratio (ref 0.0–4.4)
HDL: 62 mg/dL (ref >39)
LDL Calculated: 140 mg/dL — ABNORMAL HIGH (ref 0–99)
Triglycerides: 119 mg/dL (ref 0–149)
VLDL CHOLESTEROL CAL: 24 mg/dL (ref 5–40)

## 2016-12-22 LAB — HEPATIC FUNCTION PANEL
ALK PHOS: 95 IU/L (ref 39–117)
ALT: 22 IU/L (ref 0–32)
AST: 19 IU/L (ref 0–40)
Albumin: 4.6 g/dL (ref 3.6–4.8)
BILIRUBIN TOTAL: 0.5 mg/dL (ref 0.0–1.2)
BILIRUBIN, DIRECT: 0.11 mg/dL (ref 0.00–0.40)
Total Protein: 7.4 g/dL (ref 6.0–8.5)

## 2016-12-26 ENCOUNTER — Encounter: Payer: Self-pay | Admitting: Family Medicine

## 2016-12-27 ENCOUNTER — Ambulatory Visit (HOSPITAL_COMMUNITY)
Admission: RE | Admit: 2016-12-27 | Discharge: 2016-12-27 | Disposition: A | Discharge status: Home / Self Care | Payer: Medicaid Other | Source: Ambulatory Visit | Attending: Nurse Practitioner | Admitting: Nurse Practitioner

## 2016-12-27 DIAGNOSIS — Z1231 Encounter for screening mammogram for malignant neoplasm of breast: Secondary | ICD-10-CM | POA: Diagnosis not present

## 2016-12-28 ENCOUNTER — Telehealth: Payer: Self-pay

## 2016-12-28 NOTE — Telephone Encounter (Signed)
Daughter was made aware of lab results and that she needed to make healthier meal choices, to help w the LDL Choles.

## 2017-01-04 ENCOUNTER — Encounter: Payer: Self-pay | Admitting: Obstetrics and Gynecology

## 2017-01-04 ENCOUNTER — Ambulatory Visit (INDEPENDENT_AMBULATORY_CARE_PROVIDER_SITE_OTHER): Payer: Medicaid Other | Admitting: Obstetrics and Gynecology

## 2017-01-04 VITALS — BP 114/70 | HR 73 | Ht 65.0 in | Wt 220.0 lb

## 2017-01-04 DIAGNOSIS — L723 Sebaceous cyst: Secondary | ICD-10-CM | POA: Insufficient documentation

## 2017-01-04 NOTE — Progress Notes (Signed)
[facility] Visit  01/04/2017            Patient name: Angel Russell MRN [id number]  Date of birth: Aug 22, 1950  CC & HPI:  Angel Russell is a 66 y.o. female presenting today for labial cyst with unknown onset., Present since At least 10 years ago Pt says NP Sumner Boast, who she saw on 12/21/16, referred her to a GYN. She has not had any pain associated with the cyst, but believes it has grown. No alleviating factors noted. She has had a hysterectomy with BSO.  [place] Optometrist used due to pt not speaking [place].  ROS:  ROS  -fever -pain +labial cyst   Pertinent History Reviewed:   Reviewed: Significant for ovarian cancer, labial cyst Medical         Past Medical History:  Diagnosis Date  . Arthritis   . Family history of bladder problems   . Labial cyst   . Ovarian cancer ([place])   . Ulcer of small intestine                               Surgical Hx:    Past Surgical History:  Procedure Laterality Date  . APPENDECTOMY    . FOOT SURGERY    . TOTAL ABDOMINAL HYSTERECTOMY W/ BILATERAL SALPINGOOPHORECTOMY     Medications: Reviewed & Updated - see associated section                       Current Outpatient Prescriptions:  .  ibuprofen (ADVIL,MOTRIN) 200 MG tablet, Take 200 mg by mouth every 4 (four) hours as needed., Disp: , Rfl:  .  Multiple Vitamins-Minerals (MULTIVITAMIN ADULT PO), Take by mouth., Disp: , Rfl:  .  meloxicam (MOBIC) 15 MG tablet, Take 1 tablet (15 mg total) by mouth daily. Prn pain (Patient not taking: Reported on 12/21/2016), Disp: 30 tablet, Rfl: 2 .  oxyCODONE-acetaminophen (PERCOCET/ROXICET) 5-325 MG tablet, Take 1 tablet by mouth every 6 (six) hours as needed for severe pain., Disp: , Rfl:    Social History: Reviewed -  reports that she has never smoked. She has never used smokeless tobacco.  Objective Findings:  Vitals: Blood pressure 114/70, pulse 73, height 5\' 5"  (1.651 m), weight 220 lb (99.8 kg).  Physical Examination: General appearance  - alert, well appearing, and in no distress Mental status - alert, oriented to person, place, and time Pelvic -  VULVA: 1 cm stable sebaceous cyst in right labia majora VAGINA: normal appearing vagina with normal color and discharge, no lesions,  CERVIX: surgically absent UTERUS: surgically absent  ADNEXA: surgically absent   Assessment & Plan:   A:  1. Stable sebaceous cyst in right labia majora  P:  1. Pt does not want removal of cyst unless it bothers her in the future 2. Explained through translator the benign nature of the subcutaneous sebaceous cyst, and discussed treatment options including continued monitoring, local excision in the office and patient elects to simply follow for now she'll let us know if it becomes a concern    By signing my name below, I, Izna Ahmed, attest that this documentation has been prepared under the direction and in the presence of Jonnie Kind, MD. Electronically Signed: Jabier Gauss, Medical Scribe. 01/04/17. 11:41 AM.  I personally performed the services described in this documentation, which was SCRIBED in my presence. The recorded information has been reviewed and considered  accurate. It has been edited as necessary during review. Jonnie Kind, MD

## 2017-02-13 ENCOUNTER — Ambulatory Visit (INDEPENDENT_AMBULATORY_CARE_PROVIDER_SITE_OTHER): Payer: Medicaid Other | Admitting: *Deleted

## 2017-02-13 DIAGNOSIS — Z23 Encounter for immunization: Secondary | ICD-10-CM | POA: Diagnosis not present

## 2018-02-06 ENCOUNTER — Encounter: Payer: Self-pay | Admitting: Family Medicine

## 2018-02-06 ENCOUNTER — Ambulatory Visit: Payer: Medicaid Other | Admitting: Family Medicine

## 2018-02-06 VITALS — BP 124/72 | Ht 65.0 in | Wt 212.0 lb

## 2018-02-06 DIAGNOSIS — M19041 Primary osteoarthritis, right hand: Secondary | ICD-10-CM | POA: Diagnosis not present

## 2018-02-06 DIAGNOSIS — Z85038 Personal history of other malignant neoplasm of large intestine: Secondary | ICD-10-CM

## 2018-02-06 DIAGNOSIS — Z1231 Encounter for screening mammogram for malignant neoplasm of breast: Secondary | ICD-10-CM

## 2018-02-06 DIAGNOSIS — Z0000 Encounter for general adult medical examination without abnormal findings: Secondary | ICD-10-CM | POA: Diagnosis not present

## 2018-02-06 DIAGNOSIS — Z23 Encounter for immunization: Secondary | ICD-10-CM

## 2018-02-06 DIAGNOSIS — Z13220 Encounter for screening for lipoid disorders: Secondary | ICD-10-CM

## 2018-02-06 DIAGNOSIS — M19042 Primary osteoarthritis, left hand: Secondary | ICD-10-CM

## 2018-02-06 MED ORDER — MELOXICAM 15 MG PO TABS: 15.0000 mg | ORAL_TABLET | Freq: Every day | ORAL | 5 refills | Status: DC

## 2018-02-06 NOTE — Progress Notes (Signed)
Subjective:    Patient ID: Angel Russell, female    DOB: 1950-07-31, 67 y.o.   MRN: [id number]  HPI AWV- Annual Wellness Visit  The patient was seen for their annual wellness visit. The patient's past medical history, surgical history, and family history were reviewed. Pertinent vaccines were reviewed ( tetanus, pneumonia, shingles, flu) The patient's medication list was reviewed and updated.  The height and weight were entered.  BMI recorded in electronic record elsewhere  Cognitive screening was completed. Outcome of Mini - Cog:    Falls /depression screening electronically recorded within record elsewhere  Current tobacco usage: none (All patients who use tobacco were given written and verbal information on quitting)  Recent listing of emergency department/hospitalizations over the past year were reviewed.  current specialist the patient sees on a regular basis: none   Medicare annual wellness visit patient questionnaire was reviewed.  A written screening schedule for the patient for the next 5-10 years was given. Appropriate discussion of followup regarding next visit was discussed.  Needs refill on meloxicam for hand pain.  Patient takes it intermittently.  No major side effects.  Notes pain in both hands.  Left greater than right.     Review of Systems  Constitutional: Negative for activity change, appetite change and fatigue.  HENT: Negative for congestion and rhinorrhea.   Eyes: Negative for discharge.  Respiratory: Negative for cough, chest tightness and wheezing.   Cardiovascular: Negative for chest pain.  Gastrointestinal: Negative for abdominal pain, blood in stool and vomiting.  Endocrine: Negative for polyphagia.  Genitourinary: Negative for difficulty urinating and frequency.  Musculoskeletal: Negative for neck pain.  Skin: Negative for color change.  Allergic/Immunologic: Negative for environmental allergies and food allergies.  Neurological: Negative  for weakness and headaches.  Psychiatric/Behavioral: Negative for agitation and behavioral problems.  All other systems reviewed and are negative.      Objective:   Physical Exam  Constitutional: She is oriented to person, place, and time. She appears well-developed and well-nourished.  HENT:  Head: Normocephalic.  Right Ear: External ear normal.  Left Ear: External ear normal.  Eyes: Pupils are equal, round, and reactive to light.  Neck: Normal range of motion. No thyromegaly present.  Cardiovascular: Normal rate, regular rhythm, normal heart sounds and intact distal pulses.  No murmur heard. Pulmonary/Chest: Effort normal and breath sounds normal. No respiratory distress. She has no wheezes.  Abdominal: Soft. Bowel sounds are normal. She exhibits no distension and no mass. There is no tenderness.  Musculoskeletal: Normal range of motion. She exhibits no edema or tenderness.  Lymphadenopathy:    She has no cervical adenopathy.  Neurological: She is alert and oriented to person, place, and time. She exhibits normal muscle tone.  Skin: Skin is warm and dry.  Psychiatric: She has a normal mood and affect. Her behavior is normal.          Assessment & Plan:  Impression wellness exam.  Usually accompanied by daughter who speaks [place].  Utilized an interpreter today.  Patient claims she had colon cancer including chemotherapy 10 years ago in [place].  States she feels fine now.  Discussion held between her and the interpreter.  Strongly encouraged reassessment by GI with time for at least another colonoscopy  Other wellness discussed.  Will assist in doing mammogram.  Patient had hysterectomy therefore no Pap smear obtained.  Appropriate blood work ordered  Chronic hand arthritis.  Uses Mobic as needed.  Understands potential for side effects to use  only on a as needed basis.  Vaccines discussed.  Will give flu shot.  and Prevnar.  Further recommendations based on blood  work.

## 2018-02-11 ENCOUNTER — Encounter: Payer: Self-pay | Admitting: Internal Medicine

## 2018-02-13 ENCOUNTER — Encounter: Payer: Self-pay | Admitting: Family Medicine

## 2018-02-20 ENCOUNTER — Encounter (HOSPITAL_COMMUNITY): Payer: Self-pay

## 2018-02-20 ENCOUNTER — Ambulatory Visit (HOSPITAL_COMMUNITY)
Admission: RE | Admit: 2018-02-20 | Discharge: 2018-02-20 | Disposition: A | Discharge status: Home / Self Care | Payer: Medicaid Other | Source: Ambulatory Visit | Attending: Family Medicine | Admitting: Family Medicine

## 2018-02-20 DIAGNOSIS — Z1231 Encounter for screening mammogram for malignant neoplasm of breast: Secondary | ICD-10-CM | POA: Insufficient documentation

## 2018-03-25 ENCOUNTER — Other Ambulatory Visit: Payer: Self-pay

## 2018-03-25 DIAGNOSIS — Z1211 Encounter for screening for malignant neoplasm of colon: Secondary | ICD-10-CM

## 2018-04-08 DIAGNOSIS — Z0000 Encounter for general adult medical examination without abnormal findings: Secondary | ICD-10-CM | POA: Diagnosis not present

## 2018-04-09 LAB — BASIC METABOLIC PANEL
BUN / CREAT RATIO: 17 (ref 12–28)
BUN: 14 mg/dL (ref 8–27)
CALCIUM: 8.9 mg/dL (ref 8.7–10.3)
CO2: 22 mmol/L (ref 20–29)
CREATININE: 0.83 mg/dL (ref 0.57–1.00)
Chloride: 105 mmol/L (ref 96–106)
GFR calc Af Amer: 84 mL/min/{1.73_m2} (ref >59)
GFR calc non Af Amer: 73 mL/min/{1.73_m2} (ref >59)
GLUCOSE: 85 mg/dL (ref 65–99)
Potassium: 4 mmol/L (ref 3.5–5.2)
Sodium: 141 mmol/L (ref 134–144)

## 2018-04-09 LAB — HEPATIC FUNCTION PANEL
ALT: 14 IU/L (ref 0–32)
AST: 18 IU/L (ref 0–40)
Albumin: 4.1 g/dL (ref 3.6–4.8)
Alkaline Phosphatase: 84 IU/L (ref 39–117)
BILIRUBIN TOTAL: 0.5 mg/dL (ref 0.0–1.2)
BILIRUBIN, DIRECT: 0.13 mg/dL (ref 0.00–0.40)
TOTAL PROTEIN: 6.6 g/dL (ref 6.0–8.5)

## 2018-04-09 LAB — LIPID PANEL
CHOLESTEROL TOTAL: 196 mg/dL (ref 100–199)
Chol/HDL Ratio: 3.2 ratio (ref 0.0–4.4)
HDL: 62 mg/dL (ref >39)
LDL Calculated: 116 mg/dL — ABNORMAL HIGH (ref 0–99)
Triglycerides: 89 mg/dL (ref 0–149)
VLDL CHOLESTEROL CAL: 18 mg/dL (ref 5–40)

## 2018-04-11 ENCOUNTER — Encounter: Payer: Self-pay | Admitting: Family Medicine

## 2018-05-05 ENCOUNTER — Encounter: Payer: Self-pay | Admitting: Anesthesiology

## 2018-05-05 ENCOUNTER — Encounter
Admission: RE | Disposition: A | Discharge status: Home / Self Care | Payer: Self-pay | Source: Home / Self Care | Attending: Gastroenterology

## 2018-05-05 ENCOUNTER — Ambulatory Visit: Payer: Medicaid Other | Admitting: Anesthesiology

## 2018-05-05 ENCOUNTER — Ambulatory Visit
Admission: RE | Admit: 2018-05-05 | Discharge: 2018-05-05 | Disposition: A | Discharge status: Home / Self Care | Payer: Medicaid Other | Attending: Gastroenterology | Admitting: Gastroenterology

## 2018-05-05 DIAGNOSIS — K635 Polyp of colon: Secondary | ICD-10-CM | POA: Diagnosis not present

## 2018-05-05 DIAGNOSIS — Z8711 Personal history of peptic ulcer disease: Secondary | ICD-10-CM | POA: Insufficient documentation

## 2018-05-05 DIAGNOSIS — D122 Benign neoplasm of ascending colon: Secondary | ICD-10-CM

## 2018-05-05 DIAGNOSIS — Z8543 Personal history of malignant neoplasm of ovary: Secondary | ICD-10-CM | POA: Diagnosis not present

## 2018-05-05 DIAGNOSIS — Z6835 Body mass index (BMI) 35.0-35.9, adult: Secondary | ICD-10-CM | POA: Insufficient documentation

## 2018-05-05 DIAGNOSIS — Z79899 Other long term (current) drug therapy: Secondary | ICD-10-CM | POA: Diagnosis not present

## 2018-05-05 DIAGNOSIS — Z1211 Encounter for screening for malignant neoplasm of colon: Secondary | ICD-10-CM | POA: Diagnosis not present

## 2018-05-05 DIAGNOSIS — D126 Benign neoplasm of colon, unspecified: Secondary | ICD-10-CM | POA: Diagnosis not present

## 2018-05-05 DIAGNOSIS — E669 Obesity, unspecified: Secondary | ICD-10-CM | POA: Insufficient documentation

## 2018-05-05 HISTORY — DX: Peptic ulcer, site unspecified, unspecified as acute or chronic, without hemorrhage or perforation: K27.9

## 2018-05-05 HISTORY — DX: Helicobacter pylori (H. pylori) as the cause of diseases classified elsewhere: B96.81

## 2018-05-05 HISTORY — PX: COLONOSCOPY WITH PROPOFOL: SHX5780

## 2018-05-05 SURGERY — COLONOSCOPY WITH PROPOFOL
Anesthesia: General

## 2018-05-05 MED ORDER — SODIUM CHLORIDE 0.9 % IV SOLN
INTRAVENOUS | Status: DC
  Administered 2018-05-05: 11:00:00 via INTRAVENOUS

## 2018-05-05 MED ORDER — LIDOCAINE HCL (CARDIAC) PF 100 MG/5ML IV SOSY
PREFILLED_SYRINGE | INTRAVENOUS | Status: DC | PRN
  Administered 2018-05-05: 50 mg via INTRAVENOUS

## 2018-05-05 MED ORDER — PROPOFOL 10 MG/ML IV BOLUS
INTRAVENOUS | Status: DC | PRN
  Administered 2018-05-05: 60 mg via INTRAVENOUS
  Administered 2018-05-05: 30 mg via INTRAVENOUS

## 2018-05-05 MED ORDER — PROPOFOL 500 MG/50ML IV EMUL
INTRAVENOUS | Status: DC | PRN
  Administered 2018-05-05: 120 ug/kg/min via INTRAVENOUS

## 2018-05-05 NOTE — H&P (Signed)
Jonathon Bellows, MD [address], [place], [place], [place], [address] [address], [place], [place], [place], [address] Phone: [phone]  Fax: [phone]  Primary Care Physician:  Mikey Kirschner, MD   Pre-Procedure History & Physical: HPI:  Angel Russell is a 67 y.o. female is here for an colonoscopy.   Past Medical History:  Diagnosis Date  . Arthritis   . Family history of bladder problems   . H pylori ulcer   . Labial cyst   . Ovarian cancer ([place])   . Ulcer of small intestine     Past Surgical History:  Procedure Laterality Date  . APPENDECTOMY    . FOOT SURGERY    . TOTAL ABDOMINAL HYSTERECTOMY W/ BILATERAL SALPINGOOPHORECTOMY      Prior to Admission medications   Medication Sig Start Date End Date Taking? Authorizing Provider  ibuprofen (ADVIL,MOTRIN) 200 MG tablet Take 200 mg by mouth every 4 (four) hours as needed.    [provider]  meloxicam (MOBIC) 15 MG tablet Take 1 tablet (15 mg total) by mouth daily. Prn pain Patient not taking: Reported on 05/05/2018 02/06/18   Mikey Kirschner, MD  Multiple Vitamins-Minerals (MULTIVITAMIN ADULT PO) Take by mouth.    [provider]  oxyCODONE-acetaminophen (PERCOCET/ROXICET) 5-325 MG tablet Take 1 tablet by mouth every 6 (six) hours as needed for severe pain.    Edrick Kins, DPM    Allergies as of 03/26/2018 - Review Complete 01/04/2017  Allergen Reaction Noted  . Iodine Rash 12/14/2015    Family History  Problem Relation Age of Onset  . Varicose Veins Father   . Thyroid disease Son   . Graves' disease Son     Social History   Socioeconomic History  . Marital status: Married    Spouse name: Not on file  . Number of children: Not on file  . Years of education: Not on file  . Highest education level: Not on file  Occupational History  . Not on file  Social Needs  . Financial resource strain: Not on file  . Food insecurity:    Worry: Not on file    Inability: Not on  file  . Transportation needs:    Medical: Not on file    Non-medical: Not on file  Tobacco Use  . Smoking status: Never Smoker  . Smokeless tobacco: Never Used  Substance and Sexual Activity  . Alcohol use: No    Alcohol/week: 0.0 standard drinks    Comment: 1-2 times per month  . Drug use: No  . Sexual activity: Never  Lifestyle  . Physical activity:    Days per week: Not on file    Minutes per session: Not on file  . Stress: Not on file  Relationships  . Social connections:    Talks on phone: Not on file    Gets together: Not on file    Attends religious service: Not on file    Active member of club or organization: Not on file    Attends meetings of clubs or organizations: Not on file    Relationship status: Not on file  . Intimate partner violence:    Fear of current or ex partner: Not on file    Emotionally abused: Not on file    Physically abused: Not on file    Forced sexual activity: Not on file  Other Topics Concern  . Not on file  Social History Narrative  . Not on file  Review of Systems: See HPI, otherwise negative ROS  Physical Exam: BP 127/87   Pulse 79   Temp (!) 97 F (36.1 C) (Tympanic)   Resp 16   Ht 5\' 6"  (1.676 m)   Wt 99.8 kg   SpO2 97%   BMI 35.51 kg/m  General:   Alert,  pleasant and cooperative in NAD Head:  Normocephalic and atraumatic. Neck:  Supple; no masses or thyromegaly. Lungs:  Clear throughout to auscultation, normal respiratory effort.    Heart:  +S1, +S2, Regular rate and rhythm, No edema. Abdomen:  Soft, nontender and nondistended. Normal bowel sounds, without guarding, and without rebound.   Neurologic:  Alert and  oriented x4;  grossly normal neurologically.  Impression/Plan: Angel Russell is here for an colonoscopy to be performed for Screening colonoscopy average risk   Risks, benefits, limitations, and alternatives regarding  colonoscopy have been reviewed with the patient via interpretor.  Questions have been  answered.  All parties agreeable.   Jonathon Bellows, MD  05/05/2018, 11:17 AM

## 2018-05-05 NOTE — Transfer of Care (Signed)
Immediate Anesthesia Transfer of Care Note  Patient: Angel Russell  Procedure(s) Performed: COLONOSCOPY WITH PROPOFOL (N/A )  Patient Location: PACU  Anesthesia Type:General  Level of Consciousness: drowsy  Airway & Oxygen Therapy: Patient Spontanous Breathing and Patient connected to nasal cannula oxygen  Post-op Assessment: Report given to RN and Post -op Vital signs reviewed and stable  Post vital signs: Reviewed and stable  Last Vitals:  Vitals Value Taken Time  BP 97/60 05/05/2018 11:53 AM  Temp    Pulse 67 05/05/2018 11:53 AM  Resp 17 05/05/2018 11:53 AM  SpO2 97 % 05/05/2018 11:53 AM    Last Pain:  Vitals:   05/05/18 1058  TempSrc: Tympanic         Complications: No apparent anesthesia complications

## 2018-05-05 NOTE — Progress Notes (Signed)
[place] interpreter used for preop in Endo for colonoscopy and daughter presents who speaks [place]

## 2018-05-05 NOTE — Anesthesia Post-op Follow-up Note (Signed)
Anesthesia QCDR form completed.        

## 2018-05-05 NOTE — Anesthesia Preprocedure Evaluation (Addendum)
Anesthesia Evaluation  Patient identified by MRN, date of birth, ID band Patient awake    Reviewed: Allergy & Precautions, NPO status , Patient's Chart, lab work & pertinent test results, reviewed documented beta blocker date and time   Airway Mallampati: III  TM Distance: >3 FB     Dental  (+) Chipped   Pulmonary           Cardiovascular      Neuro/Psych    GI/Hepatic PUD,   Endo/Other    Renal/GU      Musculoskeletal  (+) Arthritis ,   Abdominal   Peds  Hematology   Anesthesia Other Findings Obese.  Reproductive/Obstetrics                            Anesthesia Physical Anesthesia Plan  ASA: III  Anesthesia Plan: General   Post-op Pain Management:    Induction: Intravenous  PONV Risk Score and Plan:   Airway Management Planned:   Additional Equipment:   Intra-op Plan:   Post-operative Plan:   Informed Consent: I have reviewed the patients History and Physical, chart, labs and discussed the procedure including the risks, benefits and alternatives for the proposed anesthesia with the patient or authorized representative who has indicated his/her understanding and acceptance.     Plan Discussed with: CRNA  Anesthesia Plan Comments:         Anesthesia Quick Evaluation

## 2018-05-05 NOTE — Op Note (Signed)
[facility] Gastroenterology Patient Name: Angel Russell Procedure Date: 05/05/2018 11:29 AM MRN: [id number] Account #: [id number] Date of Birth: 1950/07/27 Admit Type: Outpatient Age: 67 Room: [facility] ENDO ROOM 4 Gender: Female Note Status: Finalized Procedure:            Colonoscopy Indications:          Screening for colorectal malignant neoplasm Providers:            Jonathon Bellows MD, MD Referring MD:         Grace Bushy. Wolfgang Phoenix (Referring MD) Medicines:            Monitored Anesthesia Care Complications:        No immediate complications. Procedure:            Pre-Anesthesia Assessment:                       - Prior to the procedure, a History and Physical was                        performed, and patient medications, allergies and                        sensitivities were reviewed. The patient's tolerance of                        previous anesthesia was reviewed.                       - The risks and benefits of the procedure and the                        sedation options and risks were discussed with the                        patient. All questions were answered and informed                        consent was obtained.                       - ASA Grade Assessment: II - A patient with mild                        systemic disease.                       After obtaining informed consent, the colonoscope was                        passed under direct vision. Throughout the procedure,                        the patient's blood pressure, pulse, and oxygen                        saturations were monitored continuously. The                        Colonoscope was introduced through the anus and  advanced to the the cecum, identified by the                        appendiceal orifice, IC valve and transillumination.                        The colonoscopy was performed without difficulty. The                        patient tolerated the procedure  well. The quality of                        the bowel preparation was good. Findings:      The perianal and digital rectal examinations were normal.      Two sessile polyps were found in the proximal ascending colon. The       polyps were 4 to 7 mm in size. These polyps were removed with a cold       snare. Resection and retrieval were complete.      A 3 mm polyp was found in the ascending colon. The polyp was sessile.       The polyp was removed with a cold biopsy forceps. Resection and       retrieval were complete.      The exam was otherwise without abnormality on direct and retroflexion       views. Impression:           - Two 4 to 7 mm polyps in the proximal ascending colon,                        removed with a cold snare. Resected and retrieved.                       - One 3 mm polyp in the ascending colon, removed with a                        cold biopsy forceps. Resected and retrieved.                       - The examination was otherwise normal on direct and                        retroflexion views. Recommendation:       - Discharge patient to home (with escort).                       - Resume previous diet.                       - Continue present medications.                       - Await pathology results.                       - Repeat colonoscopy in 3 - 5 years for surveillance                        based on pathology results. Procedure Code(s):    --- Professional ---  45385, Colonoscopy, flexible; with removal of tumor(s),                        polyp(s), or other lesion(s) by snare technique                       45380, 55, Colonoscopy, flexible; with biopsy, single                        or multiple Diagnosis Code(s):    --- Professional ---                       D12.2, Benign neoplasm of ascending colon                       Z12.11, Encounter for screening for malignant neoplasm                        of colon CPT copyright 2018 American  Medical Association. All rights reserved. The codes documented in this report are preliminary and upon coder review may  be revised to meet current compliance requirements. Jonathon Bellows, MD Jonathon Bellows MD, MD 05/05/2018 11:51:10 AM This report has been signed electronically. Number of Addenda: 0 Note Initiated On: 05/05/2018 11:29 AM Scope Withdrawal Time: 0 hours 10 minutes 55 seconds  Total Procedure Duration: 0 hours 15 minutes 49 seconds       [facility]

## 2018-05-05 NOTE — Anesthesia Postprocedure Evaluation (Signed)
Anesthesia Post Note  Patient: Angel Russell  Procedure(s) Performed: COLONOSCOPY WITH PROPOFOL (N/A )  Patient location during evaluation: Endoscopy Anesthesia Type: General Level of consciousness: awake and alert Pain management: pain level controlled Vital Signs Assessment: post-procedure vital signs reviewed and stable Respiratory status: spontaneous breathing, nonlabored ventilation, respiratory function stable and patient connected to nasal cannula oxygen Cardiovascular status: blood pressure returned to baseline and stable Postop Assessment: no apparent nausea or vomiting Anesthetic complications: no     Last Vitals:  Vitals:   05/05/18 1213 05/05/18 1223  BP:    Pulse:    Resp: 16 16  Temp:    SpO2:      Last Pain:  Vitals:   05/05/18 1223  TempSrc:   PainSc: 0-No pain                 Alecea Trego S

## 2018-05-06 LAB — SURGICAL PATHOLOGY

## 2018-05-06 NOTE — Progress Notes (Signed)
Non-identifying voicemail.  No Message left.

## 2018-05-08 DIAGNOSIS — Z0120 Encounter for dental examination and cleaning without abnormal findings: Secondary | ICD-10-CM | POA: Diagnosis not present

## 2018-05-09 ENCOUNTER — Encounter: Payer: Self-pay | Admitting: Gastroenterology

## 2018-05-13 ENCOUNTER — Ambulatory Visit: Payer: Self-pay | Admitting: Nurse Practitioner

## 2018-05-18 ENCOUNTER — Encounter: Payer: Self-pay | Admitting: Gastroenterology

## 2018-07-15 DIAGNOSIS — Z0120 Encounter for dental examination and cleaning without abnormal findings: Secondary | ICD-10-CM | POA: Diagnosis not present

## 2018-12-08 ENCOUNTER — Encounter: Payer: Self-pay | Admitting: Nurse Practitioner

## 2018-12-08 ENCOUNTER — Ambulatory Visit (HOSPITAL_COMMUNITY)
Admission: RE | Admit: 2018-12-08 | Discharge: 2018-12-08 | Disposition: A | Discharge status: Home / Self Care | Payer: Medicaid Other | Source: Ambulatory Visit | Attending: Family Medicine | Admitting: Family Medicine

## 2018-12-08 ENCOUNTER — Ambulatory Visit (INDEPENDENT_AMBULATORY_CARE_PROVIDER_SITE_OTHER): Payer: Medicaid Other | Admitting: Nurse Practitioner

## 2018-12-08 ENCOUNTER — Other Ambulatory Visit: Payer: Self-pay

## 2018-12-08 VITALS — BP 128/84 | Temp 97.1°F | Wt 216.0 lb

## 2018-12-08 DIAGNOSIS — M25551 Pain in right hip: Secondary | ICD-10-CM | POA: Insufficient documentation

## 2018-12-08 MED ORDER — NAPROXEN 500 MG PO TABS: 500.0000 mg | ORAL_TABLET | Freq: Two times a day (BID) | ORAL | 0 refills | Status: DC

## 2018-12-08 NOTE — Patient Instructions (Signed)
Hip Bursitis  Hip bursitis is inflammation of a fluid-filled sac (bursa) in the hip joint. The bursa prevents the bones in the hip joint from rubbing against each other. Hip bursitis can cause mild to moderate pain, and symptoms often come and go over time. What are the causes? This condition may be caused by:  Injury to the hip.  Overuse of the muscles that surround the hip joint.  Previous injury or surgery of the hip.  Arthritis or gout.  Diabetes.  Thyroid disease.  Infection. In some cases, the cause may not be known. What are the signs or symptoms? Symptoms of this condition include:  Mild or moderate pain in the hip area. Pain may get worse with movement.  Tenderness and swelling of the hip, especially on the outer side of the hip.  In rare cases, the bursa may become infected. This may cause a fever, as well as warmth and redness in the area. Symptoms may come and go. How is this diagnosed? This condition may be diagnosed based on:  A physical exam.  Your medical history.  X-rays.  Removal of fluid from your inflamed bursa for testing (biopsy). You may be sent to a health care provider who specializes in bone diseases (orthopedist) or a provider who specializes in joint inflammation (rheumatologist). How is this treated? This condition is treated by resting, icing, applying pressure (compression), and raising (elevating) the injured area. This is called RICE treatment. In some cases, this may be enough to make your symptoms go away. Treatment may also include:  Using crutches.  Draining fluid out of the bursa to help relieve swelling.  Injecting medicine that helps to reduce inflammation (cortisone).  Additional medicines if the bursa is infected. Follow these instructions at home: Managing pain, stiffness, and swelling   If directed, put ice on the painful area. ? Put ice in a plastic bag. ? Place a towel between your skin and the bag. ? Leave the ice  on for 20 minutes, 2-3 times a day. ? Raise (elevate) your hip above the level of your heart as much as you can without pain. To do this, try putting a pillow under your hips while you lie down. Activity  Return to your normal activities as told by your health care provider. Ask your health care provider what activities are safe for you.  Rest and protect your hip as much as possible until your pain and swelling get better. General instructions  Take over-the-counter and prescription medicines only as told by your health care provider.  Wear compression wraps only as told by your health care provider.  Do not use your hip to support your body weight until your health care provider says that you can. Use crutches as told by your health care provider.  Gently massage and stretch your injured area as often as is comfortable.  Keep all follow-up visits as told by your health care provider. This is important. How is this prevented?  Exercise regularly, as told by your health care provider.  Warm up and stretch before being active.  Cool down and stretch after being active.  If an activity irritates your hip or causes pain, avoid the activity as much as possible.  Avoid sitting down for long periods at a time. Contact a health care provider if you:  Have a fever.  Develop new symptoms.  Have difficulty walking or doing everyday activities.  Have pain that gets worse or does not get better with medicine.  Develop red skin or a feeling of warmth in your hip area. Get help right away if you:  Cannot move your hip.  Have severe pain. Summary  Hip bursitis is inflammation of a fluid-filled sac (bursa) in the hip joint.  Hip bursitis can cause mild to moderate pain, and symptoms often come and go over time.  This condition is treated with rest, ice, compression, elevation, and medicines. This information is not intended to replace advice given to you by your health care  provider. Make sure you discuss any questions you have with your health care provider. Document Released: 10/20/2001 Document Revised: 01/06/2018 Document Reviewed: 01/06/2018 Elsevier Patient Education  2020 [employer].

## 2018-12-08 NOTE — Progress Notes (Signed)
   Subjective:    Patient ID: Angel Russell, female    DOB: 30-Jul-1950, 68 y.o.   MRN: [id number]  HPI  Patient arrives with pain in her right hip and tailbone. Patient thinks it is related to arthritis. Patient also has bad arthritis in he fingers. Patient takes med when her pain gets really bad. Has been having pain in her right hip for the past few days.  Very painful to the touch.  No history of injury or falls.  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  Also having some discomfort in her tailbone.  Patient has an interpreter to assist her today.  Some relief with ibuprofen but has to be careful about taking NSAIDs due to history of ulcer.  Takes this on a rare occasion.  Review of Systems     Objective:   Physical Exam NAD.  Alert, oriented.  Lungs clear.  Heart regular rate and rhythm.  Distinct significant tenderness noted directly in the right hip joint.  SLR negative on the right.  Extreme tenderness with rotation of the hip.  Patellar reflexes normal.  Gait slow but steady.  Mild tenderness with palpation of the coccyx.  Significant nodular changes consistent with osteoarthritis of both hands.       Assessment & Plan:   Problem List Items Addressed This Visit    None    Visit Diagnoses    Acute right hip pain    -  Primary   Relevant Orders   DG HIP UNILAT WITH PELVIS 2-3 VIEWS RIGHT     X-ray of the right hip. Meds ordered this encounter  Medications  . naproxen (NAPROSYN) 500 MG tablet    Sig: Take 1 tablet (500 mg total) by mouth 2 (two) times daily with a meal. Prn pain    Dispense:  30 tablet    Refill:  0    Order Specific Question:   Supervising Provider    Answer:   Sallee Lange A [[id number]]   Start naproxen as directed for pain.  Take with food.  Discontinue if any stomach upset.  Ice/heat applications to the hip area.  Gentle stretching.  Lidocaine patch to the area as directed.  Further follow-up based on x-ray results. Recommend physical  this fall.

## 2019-02-24 ENCOUNTER — Ambulatory Visit (INDEPENDENT_AMBULATORY_CARE_PROVIDER_SITE_OTHER): Payer: Medicaid Other | Admitting: Family Medicine

## 2019-02-24 ENCOUNTER — Encounter: Payer: Self-pay | Admitting: Family Medicine

## 2019-02-24 ENCOUNTER — Other Ambulatory Visit: Payer: Self-pay

## 2019-02-24 VITALS — BP 114/78 | Temp 97.6°F | Ht 65.5 in | Wt 219.0 lb

## 2019-02-24 DIAGNOSIS — Z23 Encounter for immunization: Secondary | ICD-10-CM | POA: Diagnosis not present

## 2019-02-24 DIAGNOSIS — Z0000 Encounter for general adult medical examination without abnormal findings: Secondary | ICD-10-CM | POA: Diagnosis not present

## 2019-02-24 MED ORDER — NAPROXEN 500 MG PO TABS: 500.0000 mg | ORAL_TABLET | Freq: Two times a day (BID) | ORAL | 2 refills | Status: DC
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
## 2019-02-25 ENCOUNTER — Other Ambulatory Visit (HOSPITAL_COMMUNITY): Payer: Self-pay | Admitting: Family Medicine

## 2019-02-25 DIAGNOSIS — Z0000 Encounter for general adult medical examination without abnormal findings: Secondary | ICD-10-CM | POA: Diagnosis not present

## 2019-02-25 DIAGNOSIS — Z1231 Encounter for screening mammogram for malignant neoplasm of breast: Secondary | ICD-10-CM

## 2019-02-26 LAB — BASIC METABOLIC PANEL
BUN/Creatinine Ratio: 19 (ref 12–28)
BUN: 16 mg/dL (ref 8–27)
CO2: 25 mmol/L (ref 20–29)
Calcium: 9.1 mg/dL (ref 8.7–10.3)
Chloride: 110 mmol/L — ABNORMAL HIGH (ref 96–106)
Creatinine, Ser: 0.86 mg/dL (ref 0.57–1.00)
GFR calc Af Amer: 80 mL/min/{1.73_m2} (ref >59)
GFR calc non Af Amer: 70 mL/min/{1.73_m2} (ref >59)
Glucose: 85 mg/dL (ref 65–99)
Potassium: 4.4 mmol/L (ref 3.5–5.2)
Sodium: 146 mmol/L — ABNORMAL HIGH (ref 134–144)

## 2019-02-26 LAB — HEPATIC FUNCTION PANEL
ALT: 19 IU/L (ref 0–32)
AST: 18 IU/L (ref 0–40)
Albumin: 4.2 g/dL (ref 3.8–4.8)
Alkaline Phosphatase: 101 IU/L (ref 39–117)
Bilirubin Total: 0.5 mg/dL (ref 0.0–1.2)
Bilirubin, Direct: 0.13 mg/dL (ref 0.00–0.40)
Total Protein: 6.9 g/dL (ref 6.0–8.5)

## 2019-02-26 LAB — LIPID PANEL
Chol/HDL Ratio: 3.4 ratio (ref 0.0–4.4)
Cholesterol, Total: 220 mg/dL — ABNORMAL HIGH (ref 100–199)
HDL: 64 mg/dL (ref >39)
LDL Chol Calc (NIH): 135 mg/dL — ABNORMAL HIGH (ref 0–99)
Triglycerides: 117 mg/dL (ref 0–149)
VLDL Cholesterol Cal: 21 mg/dL (ref 5–40)

## 2019-02-27 ENCOUNTER — Ambulatory Visit: Payer: Medicaid Other | Admitting: Nurse Practitioner

## 2019-03-01 ENCOUNTER — Encounter: Payer: Self-pay | Admitting: Family Medicine

## 2019-03-05 DIAGNOSIS — Z0120 Encounter for dental examination and cleaning without abnormal findings: Secondary | ICD-10-CM | POA: Diagnosis not present

## 2019-03-09 ENCOUNTER — Ambulatory Visit (HOSPITAL_COMMUNITY)
Admission: RE | Admit: 2019-03-09 | Discharge: 2019-03-09 | Disposition: A | Discharge status: Home / Self Care | Payer: Medicaid Other | Source: Ambulatory Visit | Attending: Family Medicine | Admitting: Family Medicine

## 2019-03-09 ENCOUNTER — Other Ambulatory Visit: Payer: Self-pay

## 2019-03-09 DIAGNOSIS — Z1231 Encounter for screening mammogram for malignant neoplasm of breast: Secondary | ICD-10-CM | POA: Insufficient documentation

## 2019-04-27 DIAGNOSIS — Z0120 Encounter for dental examination and cleaning without abnormal findings: Secondary | ICD-10-CM | POA: Diagnosis not present

## 2019-05-21 DIAGNOSIS — Z0120 Encounter for dental examination and cleaning without abnormal findings: Secondary | ICD-10-CM | POA: Diagnosis not present

## 2019-06-18 ENCOUNTER — Encounter: Payer: Self-pay | Admitting: Family Medicine

## 2019-06-29 ENCOUNTER — Other Ambulatory Visit: Payer: Self-pay | Admitting: Family Medicine

## 2019-09-07 DIAGNOSIS — Z0120 Encounter for dental examination and cleaning without abnormal findings: Secondary | ICD-10-CM | POA: Diagnosis not present

## 2019-12-10 DIAGNOSIS — Z0120 Encounter for dental examination and cleaning without abnormal findings: Secondary | ICD-10-CM | POA: Diagnosis not present

## 2020-02-25 ENCOUNTER — Encounter: Payer: Self-pay | Admitting: Family Medicine

## 2020-02-25 ENCOUNTER — Ambulatory Visit (INDEPENDENT_AMBULATORY_CARE_PROVIDER_SITE_OTHER): Payer: Medicaid Other | Admitting: Family Medicine

## 2020-02-25 VITALS — BP 118/78 | HR 86 | Temp 98.0°F | Ht 65.0 in | Wt 212.4 lb

## 2020-02-25 DIAGNOSIS — K649 Unspecified hemorrhoids: Secondary | ICD-10-CM | POA: Diagnosis not present

## 2020-02-25 DIAGNOSIS — L821 Other seborrheic keratosis: Secondary | ICD-10-CM

## 2020-02-25 DIAGNOSIS — Z0000 Encounter for general adult medical examination without abnormal findings: Secondary | ICD-10-CM

## 2020-02-25 DIAGNOSIS — R0789 Other chest pain: Secondary | ICD-10-CM | POA: Diagnosis not present

## 2020-02-25 DIAGNOSIS — M19042 Primary osteoarthritis, left hand: Secondary | ICD-10-CM

## 2020-02-25 DIAGNOSIS — H9319 Tinnitus, unspecified ear: Secondary | ICD-10-CM | POA: Diagnosis not present

## 2020-02-25 DIAGNOSIS — M19041 Primary osteoarthritis, right hand: Secondary | ICD-10-CM | POA: Diagnosis not present

## 2020-02-25 MED ORDER — NAPROXEN 500 MG PO TABS: 500.0000 mg | ORAL_TABLET | Freq: Two times a day (BID) | ORAL | 2 refills | Status: DC

## 2020-02-25 MED ORDER — HYDROCORT-PRAMOXINE (PERIANAL) 1-1 % EX FOAM: 1.0000 | Freq: Two times a day (BID) | CUTANEOUS | 1 refills | Status: DC

## 2020-02-25 NOTE — Progress Notes (Signed)
Patient ID: Angel Russell, female    DOB: 10/04/1950, 69 y.o.   MRN: [id number]   Chief Complaint  Patient presents with  . Medicare Wellness   Subjective:   HPI AWV- Annual Wellness Visit  The patient was seen for their annual wellness visit. The patient's past medical history, surgical history, and family history were reviewed. Pertinent vaccines were reviewed ( tetanus, pneumonia, shingles, flu) The patient's medication list was reviewed and updated.  The height and weight were entered.  BMI recorded in electronic record elsewhere  Cognitive screening was completed. Outcome of Mini - Cog: pass   Falls /depression screening electronically recorded within record elsewhere  Current tobacco usage: none (All patients who use tobacco were given written and verbal information on quitting)  Recent listing of emergency department/hospitalizations over the past year were reviewed.  current specialist the patient sees on a regular basis: none   Medicare annual wellness visit patient questionnaire was reviewed.  A written screening schedule for the patient for the next 5-10 years was given. Appropriate discussion of followup regarding next visit was discussed.   Daughter to take her to get labs. Grandmother with breast cancer in past. Mammogram- due in 2022.  Pt not having any concerns today with breast, no pain, or masses.  Has some skin tags on back and in middle of chest. 2 skin moles wanting them to get checked.  No having falls, daughter thinking she needs to use a cane, pt not wanting to use it.  When getting up quickly feels dizzy at times. But resolves.  Hemorrhoids for 3 days  Arthritis acting up- fingers, shoulders and knees Allergies with the weather changes   When raining and cold hurting her hands. Was given naproxen and ibuprofen in past. Helping with her pain.  occ feeling chest sqeezing feeling at times.  Not having it today.  Can't really describe when  having it, some times when getting upset or "when season's changing." not sob or leg swelling. No h/o heart attack.    Per note last physical exam- pt had a hysterectomy.  Unsure if had h/o of gyn or GI cancer. Pt reported in last note that she had a cancer that was treated with chemo about 10 yrs ago in the [place].  Recommended at that time to see GI and f/u with colonoscopy.   Medical History [place] has a past medical history of Arthritis, Family history of bladder problems, H pylori ulcer, Labial cyst, Ovarian cancer ([place]), and Ulcer of small intestine.   Outpatient Encounter Medications as of 02/25/2020  Medication Sig  . glucosamine-chondroitin 500-400 MG tablet Take 1 tablet by mouth 3 (three) times daily.  . hydrocortisone-pramoxine (PROCTOFOAM-HC) rectal foam Place 1 applicator rectally 2 (two) times daily.  Marland Kitchen ibuprofen (ADVIL,MOTRIN) 200 MG tablet Take 200 mg by mouth every 4 (four) hours as needed.  . naproxen (NAPROSYN) 500 MG tablet Take 1 tablet (500 mg total) by mouth 2 (two) times daily with a meal. Prn pain  . OVER THE COUNTER MEDICATION Fish oil magnessium calcium  . [DISCONTINUED] naproxen (NAPROSYN) 500 MG tablet Take 1 tablet (500 mg total) by mouth 2 (two) times daily with a meal. Prn pain   No facility-administered encounter medications on file as of 02/25/2020.     Review of Systems  Constitutional: Negative for chills and fever.  HENT: Negative for congestion, rhinorrhea and sore throat.   Respiratory: Positive for chest tightness. Negative for cough, shortness of breath and wheezing.  Cardiovascular: Negative for chest pain and leg swelling.  Gastrointestinal: Negative for abdominal pain, diarrhea, nausea and vomiting.       +hemorrhoids.  Genitourinary: Negative for dysuria and frequency.  Musculoskeletal: Negative for arthralgias and back pain.  Skin: Negative for rash.  Neurological: Positive for dizziness (intermittent, getting up quickly). Negative  for weakness and headaches.     Vitals BP 118/78   Pulse 86   Temp 98 F (36.7 C) (Oral)   Ht _0  (1.651 m)   Wt 212 lb 6.4 oz (96.3 kg)   SpO2 98%   BMI 35.35 kg/m   Objective:   Physical Exam Vitals and nursing note reviewed.  Constitutional:      General: She is not in acute distress.    Appearance: Normal appearance.  HENT:     Head: Normocephalic and atraumatic.     Right Ear: Tympanic membrane, ear canal and external ear normal.     Left Ear: Tympanic membrane, ear canal and external ear normal.     Nose: Nose normal.     Mouth/Throat:     Mouth: Mucous membranes are moist.     Pharynx: Oropharynx is clear.  Eyes:     Extraocular Movements: Extraocular movements intact.     Conjunctiva/sclera: Conjunctivae normal.     Pupils: Pupils are equal, round, and reactive to light.  Cardiovascular:     Rate and Rhythm: Normal rate and regular rhythm.     Pulses: Normal pulses.     Heart sounds: Normal heart sounds.  Pulmonary:     Effort: Pulmonary effort is normal.     Breath sounds: Normal breath sounds. No wheezing, rhonchi or rales.  Abdominal:     General: Abdomen is flat. Bowel sounds are normal. There is no distension.     Palpations: Abdomen is soft. There is no mass.     Tenderness: There is no abdominal tenderness. There is no guarding or rebound.     Hernia: No hernia is present.  Musculoskeletal:        General: Normal range of motion.     Right lower leg: No edema.     Left lower leg: No edema.  Skin:    General: Skin is warm and dry.     Findings: No lesion or rash.  Neurological:     General: No focal deficit present.     Mental Status: She is alert and oriented to person, place, and time.     Cranial Nerves: No cranial nerve deficit.     Motor: No weakness.     Gait: Gait normal.  Psychiatric:        Mood and Affect: Mood normal.        Behavior: Behavior normal.      Assessment and Plan   1. Medicare annual wellness visit,  subsequent  2. Laboratory tests ordered as part of a complete physical exam (CPE) - CBC - CMP14+EGFR - Lipid panel - TSH  3. Seborrheic keratoses  4. Tinnitus, unspecified laterality  5. Chest tightness - PR ELECTROCARDIOGRAM, COMPLETE  6. Hemorrhoids, unspecified hemorrhoid type - hydrocortisone-pramoxine (PROCTOFOAM-HC) rectal foam; Place 1 applicator rectally 2 (two) times daily.  Dispense: 30 g; Refill: 1  7. Primary osteoarthritis of both hands - naproxen (NAPROSYN) 500 MG tablet; Take 1 tablet (500 mg total) by mouth 2 (two) times daily with a meal. Prn pain  Dispense: 30 tablet; Refill: 2   Medicare wellness-pt to get mammogram next year.   seb  keratosis- on back/and on chest.  Not wanting to see derm at this time.  Benign, gave reassurance.  Tinnitus- Pt declining to see audiology at this time.   Chest tightness-intermittent- EKG unremarkable. Advising if pt having it more frequently or severe then call or rto. Pt to get labs.  EKG- sinus rhythm, no st elevation or ischemic changes.  Hemorrhoids- intermittent, gave proctofoam.  arthritis of hands-stable,  take naproxen 2x per day prn.  Pt to get labs in the next 1-2 wks.  F/u [place] prn.

## 2020-03-04 DIAGNOSIS — Z0000 Encounter for general adult medical examination without abnormal findings: Secondary | ICD-10-CM | POA: Diagnosis not present

## 2020-03-05 LAB — CMP14+EGFR
ALT: 18 IU/L (ref 0–32)
AST: 20 IU/L (ref 0–40)
Albumin/Globulin Ratio: 1.4 (ref 1.2–2.2)
Albumin: 4.3 g/dL (ref 3.8–4.8)
Alkaline Phosphatase: 103 IU/L (ref 44–121)
BUN/Creatinine Ratio: 20 (ref 12–28)
BUN: 18 mg/dL (ref 8–27)
Bilirubin Total: 0.4 mg/dL (ref 0.0–1.2)
CO2: 24 mmol/L (ref 20–29)
Calcium: 9.4 mg/dL (ref 8.7–10.3)
Chloride: 106 mmol/L (ref 96–106)
Creatinine, Ser: 0.88 mg/dL (ref 0.57–1.00)
GFR calc Af Amer: 78 mL/min/{1.73_m2} (ref >59)
GFR calc non Af Amer: 67 mL/min/{1.73_m2} (ref >59)
Globulin, Total: 3.1 g/dL (ref 1.5–4.5)
Glucose: 85 mg/dL (ref 65–99)
Potassium: 4.5 mmol/L (ref 3.5–5.2)
Sodium: 140 mmol/L (ref 134–144)
Total Protein: 7.4 g/dL (ref 6.0–8.5)

## 2020-03-05 LAB — CBC
Hematocrit: 42.6 % (ref 34.0–46.6)
Hemoglobin: 13.8 g/dL (ref 11.1–15.9)
MCH: 28.5 pg (ref 26.6–33.0)
MCHC: 32.4 g/dL (ref 31.5–35.7)
MCV: 88 fL (ref 79–97)
Platelets: 238 10*3/uL (ref 150–450)
RBC: 4.84 x10E6/uL (ref 3.77–5.28)
RDW: 13 % (ref 11.7–15.4)
WBC: 4.7 10*3/uL (ref 3.4–10.8)

## 2020-03-05 LAB — LIPID PANEL
Chol/HDL Ratio: 3.8 ratio (ref 0.0–4.4)
Cholesterol, Total: 226 mg/dL — ABNORMAL HIGH (ref 100–199)
HDL: 60 mg/dL (ref >39)
LDL Chol Calc (NIH): 145 mg/dL — ABNORMAL HIGH (ref 0–99)
Triglycerides: 117 mg/dL (ref 0–149)
VLDL Cholesterol Cal: 21 mg/dL (ref 5–40)

## 2020-03-05 LAB — TSH: TSH: 4.75 u[IU]/mL — ABNORMAL HIGH (ref 0.450–4.500)

## 2020-03-07 ENCOUNTER — Telehealth: Payer: Self-pay | Admitting: Family Medicine

## 2020-03-07 DIAGNOSIS — E039 Hypothyroidism, unspecified: Secondary | ICD-10-CM

## 2020-03-07 MED ORDER — LEVOTHYROXINE SODIUM 50 MCG PO TABS: 50.0000 ug | ORAL_TABLET | Freq: Every day | ORAL | 1 refills | Status: DC

## 2020-03-07 NOTE — Telephone Encounter (Signed)
Left message to return call 

## 2020-03-09 DIAGNOSIS — Z0120 Encounter for dental examination and cleaning without abnormal findings: Secondary | ICD-10-CM | POA: Diagnosis not present

## 2020-03-09 NOTE — Telephone Encounter (Signed)
Left message to return call 

## 2020-03-14 ENCOUNTER — Other Ambulatory Visit: Payer: Self-pay | Admitting: *Deleted

## 2020-03-14 DIAGNOSIS — E039 Hypothyroidism, unspecified: Secondary | ICD-10-CM

## 2020-03-14 NOTE — Telephone Encounter (Signed)
Patient daughter notified of results and verbalized understanding. Will pick up med. Lab ordered and sent to front to schedule appointment

## 2020-03-14 NOTE — Telephone Encounter (Signed)
Pt daughter returned call only available to 11:45 she teaches or after 3:30   Cell [phone] [place])

## 2020-03-24 ENCOUNTER — Other Ambulatory Visit (HOSPITAL_COMMUNITY): Payer: Self-pay | Admitting: Nurse Practitioner

## 2020-03-24 DIAGNOSIS — Z1231 Encounter for screening mammogram for malignant neoplasm of breast: Secondary | ICD-10-CM

## 2020-03-25 DIAGNOSIS — L2489 Irritant contact dermatitis due to other agents: Secondary | ICD-10-CM | POA: Diagnosis not present

## 2020-03-25 DIAGNOSIS — I10 Essential (primary) hypertension: Secondary | ICD-10-CM | POA: Diagnosis not present

## 2020-03-25 DIAGNOSIS — R0902 Hypoxemia: Secondary | ICD-10-CM | POA: Diagnosis not present

## 2020-03-25 DIAGNOSIS — R21 Rash and other nonspecific skin eruption: Secondary | ICD-10-CM | POA: Diagnosis not present

## 2020-03-28 ENCOUNTER — Other Ambulatory Visit: Payer: Self-pay | Admitting: Nurse Practitioner

## 2020-03-28 MED ORDER — EPINEPHRINE 0.3 MG/0.3ML IJ SOAJ: 0.3000 mg | INTRAMUSCULAR | 0 refills | Status: DC | PRN

## 2020-03-28 NOTE — Progress Notes (Signed)
Phone call from patient's daughter. She does not speak [place]. Started her thyroid medicine Monday last week. Took everyday. Had a severe allergic reaction Friday. Had taken a dose of Benadryl when it started. Broke out in hives all over and had trouble breathing. EMS was called. By the time they got there, no further breathing issues. Did not go to ED. Symptoms have resolved except for occasional mild itching.  Does not have Epi Pen. Has not taken any more Levothyroxine.  Hold on thyroid med.  Epi pen ordered. Use as directed for severe allergy with breathing problems or swelling in the throat. Take Benadryl as directed prn. Will schedule an appointment this Friday and plan referral to allergist.  Most likely an ingredient in the generic thyroid medicine.

## 2020-04-01 ENCOUNTER — Ambulatory Visit (INDEPENDENT_AMBULATORY_CARE_PROVIDER_SITE_OTHER): Payer: Medicaid Other | Admitting: Nurse Practitioner

## 2020-04-01 ENCOUNTER — Other Ambulatory Visit: Payer: Self-pay

## 2020-04-01 ENCOUNTER — Encounter: Payer: Self-pay | Admitting: Nurse Practitioner

## 2020-04-01 VITALS — BP 118/78 | HR 95 | Temp 97.8°F | Wt 210.4 lb

## 2020-04-01 DIAGNOSIS — Z888 Allergy status to other drugs, medicaments and biological substances status: Secondary | ICD-10-CM

## 2020-04-01 DIAGNOSIS — R21 Rash and other nonspecific skin eruption: Secondary | ICD-10-CM

## 2020-04-01 DIAGNOSIS — T783XXA Angioneurotic edema, initial encounter: Secondary | ICD-10-CM

## 2020-04-01 DIAGNOSIS — T782XXA Anaphylactic shock, unspecified, initial encounter: Secondary | ICD-10-CM

## 2020-04-01 NOTE — Patient Instructions (Addendum)
Hold all thyroid medication at this time Referral to Dr. Ernst Bowler, Allergy Specialist Keep EPI PEN on person at all times for emergency use if difficulty breathing

## 2020-04-01 NOTE — Progress Notes (Signed)
   Subjective:    Subjective   Patient ID: Angel Russell, female    DOB: 10-28-1950, 69 y.o.   MRN: [id number]  Patient presents to office for medication follow. Daughter present per patient request. Patient started levothyroxine last Tuesday and experienced an allergic reaction the following Friday. Symptoms included SOB with anaphylaxis, hives over entire body, faint feeling, and severe itching. Patient took 50 mg of Benadryl as instructed by EMS and anaphylaxis resolved. Hives remained x 2 days post event. Patient has refrained from levothyroxine since event and has had no further reactions. Daughter contributes that patient received COVID 19 booster two weeks before incident. Patient also recalls taking Naproxen for her arthritis the same day, however this medication is not new to her. Confirms allergy to iodine which causes rash.  No other known allergens. No other new medications.   Review of Systems  Constitutional: Negative for chills and fever.  Respiratory: Positive for shortness of breath (Resolved).   Cardiovascular: Negative for chest pain and palpitations.  Gastrointestinal: Negative for abdominal pain, diarrhea, nausea and vomiting.  Skin: Positive for itching (resolved) and rash (resolved).  Endo/Heme/Allergies: Negative for environmental allergies.      Objective:   Objective  Vitals:   04/01/20 1558  BP: 118/78  Pulse: 95  Temp: 97.8 F (36.6 C)  SpO2: 97%   Physical Exam Constitutional:      General: She is not in acute distress.    Appearance: Normal appearance.  Cardiovascular:     Rate and Rhythm: Normal rate and regular rhythm.     Heart sounds: Normal heart sounds. No murmur heard.  No gallop.   Pulmonary:     Effort: Pulmonary effort is normal. No respiratory distress.     Breath sounds: Normal breath sounds. No stridor. No wheezing.  Skin:    General: Skin is warm and dry.     Findings: No erythema or rash.  Neurological:     Mental  Status: She is alert and oriented to person, place, and time.      Assessment & Plan:   Problem List Items Addressed This Visit      Other   Anaphylactic syndrome    Other Visit Diagnoses    Allergic angioedema, initial encounter    -  Primary   Relevant Orders   Ambulatory referral to Allergy      Hold all thyroid medication at this time Referral to allergy specialist.  Keep EPI PEN on person at all times for emergency use if difficulty breathing Demonstrated for patient and daughter epi pen administration with tester. Performed return demonstration.   Return if symptoms worsen or fail to improve.

## 2020-04-01 NOTE — Progress Notes (Signed)
   Subjective:    Patient ID: Angel Russell, female    DOB: 1950-06-26, 69 y.o.   MRN: [id number]  Patient started levothyroxine last Monday and on Friday had an anaphylactic reaction. Patient used benadryl at home and was able to control her symptoms. Patient has not taken levothyroxine since then.      Review of Systems     Objective:   Physical Exam        Assessment & Plan:

## 2020-04-02 ENCOUNTER — Encounter: Payer: Self-pay | Admitting: Nurse Practitioner

## 2020-04-02 DIAGNOSIS — T782XXA Anaphylactic shock, unspecified, initial encounter: Secondary | ICD-10-CM | POA: Insufficient documentation

## 2020-04-25 ENCOUNTER — Ambulatory Visit: Payer: Medicaid Other | Admitting: Family Medicine

## 2020-05-02 ENCOUNTER — Ambulatory Visit (HOSPITAL_COMMUNITY)
Admission: RE | Admit: 2020-05-02 | Discharge: 2020-05-02 | Disposition: A | Discharge status: Home / Self Care | Payer: Medicaid Other | Source: Ambulatory Visit | Attending: Nurse Practitioner | Admitting: Nurse Practitioner

## 2020-05-02 ENCOUNTER — Other Ambulatory Visit: Payer: Self-pay

## 2020-05-02 DIAGNOSIS — Z1231 Encounter for screening mammogram for malignant neoplasm of breast: Secondary | ICD-10-CM | POA: Diagnosis not present

## 2020-06-01 ENCOUNTER — Encounter: Payer: Self-pay | Admitting: Allergy & Immunology

## 2020-06-01 ENCOUNTER — Other Ambulatory Visit: Payer: Self-pay

## 2020-06-01 ENCOUNTER — Ambulatory Visit (INDEPENDENT_AMBULATORY_CARE_PROVIDER_SITE_OTHER): Payer: Medicaid Other | Admitting: Allergy & Immunology

## 2020-06-01 VITALS — BP 110/78 | HR 77 | Temp 98.1°F | Resp 18 | Ht 66.14 in | Wt 210.0 lb

## 2020-06-01 DIAGNOSIS — R748 Abnormal levels of other serum enzymes: Secondary | ICD-10-CM

## 2020-06-01 DIAGNOSIS — E039 Hypothyroidism, unspecified: Secondary | ICD-10-CM

## 2020-06-01 DIAGNOSIS — T7840XD Allergy, unspecified, subsequent encounter: Secondary | ICD-10-CM

## 2020-06-01 DIAGNOSIS — L500 Allergic urticaria: Secondary | ICD-10-CM

## 2020-06-01 DIAGNOSIS — M1990 Unspecified osteoarthritis, unspecified site: Secondary | ICD-10-CM | POA: Diagnosis not present

## 2020-06-01 DIAGNOSIS — L299 Pruritus, unspecified: Secondary | ICD-10-CM

## 2020-06-01 NOTE — Progress Notes (Signed)
NEW PATIENT  Date of Service/Encounter:  06/01/20  Referring provider: Erven Colla, DO   Assessment:   Allergic reaction  Hypothyroidism - did not tolerate generic levothyroxine  Arthritis - controlled with as needed naproxen   Ms. Reckling presents for an evaluation of a possible drug allergy.  Her urticaria and itching certainly did show up after initiation of the generic levothyroxine.  I have had a couple of patients in the past to have had reactions to generic levothyroxine but tolerated brand-name Synthroid.  I recommend that we try the brand-name Synthroid.  She can get her first dose here in the office so that we can watch her.  If she reacts to this, I will defer to her primary care provider regarding medications to try to treat her hypothyroidism.  Patient seems to be in agreement with this plan.  I do not think that the naproxen has anything to do with her reactions, as she has been taking this for several years.  She uses this on a as needed basis anyway.  We are going to get some screening labs to look for serious causes of difficult to control hives, but at this point it seems more likely this is related to the medication.  Plan/Recommendations:   1. Allergic reaction - I am going to get some labs to see if you are having hives/swelling from other causes. - We will call you in 1-2 weeks with the results of the testing. - I will call your family doctor to discuss the plan regarding future thyroid medications. - We may try the brand name Synthroid to see if this is tolerated better.  - We will call you with a plan and give the first dose of the new medication here in our clinic to make sure that you tolerate it without a problem. - You can restart the naproxen (arthritis medication) since you were tolerating this for years without a problem. - I think the likely trigger is the thyroid medication.   2. Return in about 4 weeks (around 06/29/2020) for DRUG  CHALLENGE.  Subjective:   Dajour Wyeth is a 70 y.o. female presenting today for evaluation of  Chief Complaint  Patient presents with  . Allergic Reaction    Donnamarie Reddic has a history of the following: Patient Active Problem List   Diagnosis Date Noted  . 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 Hemorrhoids 02/25/2020  . Tinnitus 02/25/2020  . 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 Sebaceous cyst 01/04/2017  . 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 Bunion of great toe of left foot 11/18/2015    History obtained from: chart review and patient and an interpreter.   Uliana Ladue was referred by Erven Colla, DO.     Reyhan is a 70 y.o. female presenting for an evaluation of an allergic reaction.  She was diagnosed with a thyroid disorder. She was given a medicine. She took it for one week and then she started having "allergies". She had a red spot on her leg and it spread over her entire body.  She reports that she had problems breathing because it was itching so much. It was very swollen and she reports that she had swelling over her entire body.   She started the medicine levothyroxine. It looks like it was prescribed on October 25th, 2021. She took it regularly for one week. She took it a few days at the most. She stopped it and  the swelling improved over the course of a few days. She took Benadryl with improvement in the reactions. She actually had to call the ambulance and they took her to the ED. They gave her Benadryl and she continued to take that with improvement. Around one week after stopping the medication, symptoms got back to normal. She takes the naproxen for the naproxen. She has been on the naproxen for 2-3 years.  She has a medicine for arthritis and she stopped this as well. The reaction was so severe that she took both medications.   She never had any problems like this before October. She did have some  itching but nothing to the same extent. She does have a reaction to iodine a long time ago.   Otherwise, there is no history of other atopic diseases, including asthma, food allergies, drug allergies, environmental allergies, stinging insect allergies or contact dermatitis. There is no significant infectious history. Vaccinations are up to date.    Past Medical History: Patient Active Problem List   Diagnosis Date Noted  . 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 Hemorrhoids 02/25/2020  . Tinnitus 02/25/2020  . 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 Sebaceous cyst 01/04/2017  . 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 Bunion of great toe of left foot 11/18/2015    Medication List:  Allergies as of 06/01/2020      Reactions   Iodine Rash      Medication List       Accurate as of June 01, 2020 11:59 PM. If you have any questions, ask your nurse or doctor.        EPINEPHrine 0.3 mg/0.3 mL Soaj injection Commonly known as: EPI-PEN Inject 0.3 mg into the muscle as needed for anaphylaxis.   glucosamine-chondroitin 500-400 MG tablet Take 1 tablet by mouth 3 (three) times daily.   hydrocortisone-pramoxine rectal foam Commonly known as: PROCTOFOAM-HC Place 1 applicator rectally 2 (two) times daily.   ibuprofen 200 MG tablet Commonly known as: ADVIL Take 200 mg by mouth every 4 (four) hours as needed.   levothyroxine 50 MCG tablet Commonly known as: SYNTHROID Take 1 tablet (50 mcg total) by mouth daily.   naproxen 500 MG tablet Commonly known as: Naprosyn Take 1 tablet (500 mg total) by mouth 2 (two) times daily with a meal. Prn pain   OVER THE COUNTER MEDICATION Fish oil magnessium calcium       Birth History: non-contributory  Developmental History: non-contributory  Past Surgical History: Past Surgical History:  Procedure Laterality Date  . APPENDECTOMY    . COLONOSCOPY WITH PROPOFOL N/A 05/05/2018    Procedure: COLONOSCOPY WITH PROPOFOL;  Surgeon: Jonathon Bellows, MD;  [facility] END[facility];  Service: Gastroenterology;  Laterality: N/A;  . FOOT SURGERY    . TOTAL ABDOMINAL HYSTERECTOMY W/ BILATERAL SALPINGOOPHORECTOMY       Family History: Family History  Problem Relation Age of Onset  . Varicose Veins Father   . Thyroid disease Son   . Graves' disease Son      Social History: Aubreeana lives at home with her daughter. They live in a house that was built in 2001.  There is carpeting and wood throughout the home.  There is no mildew or mold damage.  They have electric and wood for heating and central cooling.  There is a dog and a cat inside of the home.  There are no dust mite covers on the bedding.  There is no tobacco exposure.  She is currently retired.  There is no exposure to fumes, chemicals, or dust.  They do not live near an interstate or industrial area.   Review of Systems  Constitutional: Negative.  Negative for chills, fever, malaise/fatigue and weight loss.  HENT: Negative.  Negative for congestion, ear discharge and ear pain.   Eyes: Negative for pain, discharge and redness.  Respiratory: Negative for cough, sputum production, shortness of breath and wheezing.   Cardiovascular: Negative.  Negative for chest pain and palpitations.  Gastrointestinal: Negative for abdominal pain, constipation, diarrhea, heartburn, nausea and vomiting.  Skin: Positive for itching and rash.  Neurological: Negative for dizziness and headaches.  Endo/Heme/Allergies: Negative for environmental allergies. Does not bruise/bleed easily.       Objective:   Blood pressure 110/78, pulse 77, temperature 98.1 F (36.7 C), temperature source Temporal, resp. rate 18, height 5' 6.14" (1.68 m), weight 210 lb (95.3 kg), SpO2 98 %. Body mass index is 33.75 kg/m.   Physical Exam:   Physical Exam Constitutional:      Appearance: She is well-developed.  HENT:     Head: Normocephalic and  atraumatic.     Right Ear: Tympanic membrane, ear canal and external ear normal. No drainage, swelling or tenderness. Tympanic membrane is not injected, scarred, erythematous, retracted or bulging.     Left Ear: Tympanic membrane, ear canal and external ear normal. No drainage, swelling or tenderness. Tympanic membrane is not injected, scarred, erythematous, retracted or bulging.     Nose: No nasal deformity, septal deviation, mucosal edema, rhinorrhea or epistaxis.     Right Sinus: No maxillary sinus tenderness or frontal sinus tenderness.     Left Sinus: No maxillary sinus tenderness or frontal sinus tenderness.     Mouth/Throat:     Mouth: Oropharynx is clear and moist. Mucous membranes are not pale and not dry.     Pharynx: Uvula midline.  Eyes:     General:        Right eye: No discharge.        Left eye: No discharge.     Extraocular Movements: EOM normal.     Conjunctiva/sclera: Conjunctivae normal.     Right eye: Right conjunctiva is not injected. No chemosis.    Left eye: Left conjunctiva is not injected. No chemosis.    Pupils: Pupils are equal, round, and reactive to light.  Cardiovascular:     Rate and Rhythm: Normal rate and regular rhythm.     Heart sounds: Normal heart sounds.  Pulmonary:     Effort: Pulmonary effort is normal. No tachypnea, accessory muscle usage or respiratory distress.     Breath sounds: Normal breath sounds. No wheezing, rhonchi or rales.  Chest:     Chest wall: No tenderness.  Abdominal:     Tenderness: There is no abdominal tenderness. There is no guarding or rebound.  Lymphadenopathy:     Head:     Right side of head: No submandibular, tonsillar or occipital adenopathy.     Left side of head: No submandibular, tonsillar or occipital adenopathy.     Cervical: No cervical adenopathy.  Skin:    General: Skin is warm.     Capillary Refill: Capillary refill takes less than 2 seconds.     Coloration: Skin is not pale.     Findings: No abrasion,  erythema, petechiae or rash. Rash is not papular, urticarial or vesicular.     Comments: There are some excoriation marks in the bilateral arms.  Neurological:  Mental Status: She is alert.  Psychiatric:        Mood and Affect: Mood and affect normal.      Diagnostic studies: labs sent instead      Salvatore Marvel, MD [facility]

## 2020-06-01 NOTE — Patient Instructions (Addendum)
1. Allergic reaction - I am going to get some labs to see if you are having hives/swelling from other causes. - We will call you in 1-2 weeks with the results of the testing. - I will call your family doctor to discuss the plan regarding future thyroid medications. - We may try the brand name Synthroid to see if this is tolerated better.  - We will call you with a plan and give the first dose of the new medication here in our clinic to make sure that you tolerate it without a problem. - You can restart the naproxen (arthritis medication) since you were tolerating this for years without a problem. - I think the likely trigger is the thyroid medication.   2. Return in about 4 weeks (around 06/29/2020) for DRUG CHALLENGE .     Please inform us of any Emergency Department visits, hospitalizations, or changes in symptoms. Call us before going to the ED for breathing or allergy symptoms since we might be able to fit you in for a sick visit. Feel free to contact us anytime with any questions, problems, or concerns.  It was a pleasure to meet you today!  Websites that have reliable patient information: 1. American Academy of Asthma, Allergy, and Immunology: www.aaaai.org 2. Food Allergy Research and Education (FARE): foodallergy.org 3. Mothers of Asthmatics: http://www.asthmacommunitynetwork.org 4. American College of Allergy, Asthma, and Immunology: www.acaai.org   COVID-19 Vaccine Information can be found at: ShippingScam.co.uk For questions related to vaccine distribution or appointments, please email vaccine@[email or web address] .com or call [phone].     "Like" [place] on Facebook and Instagram for our latest updates!       Make sure you are registered to vote! If you have moved or changed any of your contact information, you will need to get this updated before voting!  In some cases, you MAY be able to register to vote online:  [email or web address]

## 2020-06-02 ENCOUNTER — Encounter: Payer: Self-pay | Admitting: Allergy & Immunology

## 2020-06-03 ENCOUNTER — Telehealth: Payer: Self-pay | Admitting: *Deleted

## 2020-06-03 NOTE — Telephone Encounter (Signed)
Daughter(DPR) would like Angel Russell to look at her blood work from the allergy specialist, especially CBC and explain what it means

## 2020-06-06 ENCOUNTER — Telehealth: Payer: Self-pay

## 2020-06-06 NOTE — Telephone Encounter (Signed)
I reviewed the labs, make sure pt follows up with the Allergist to review labs and the new synthroid challenge to see if she can tolerate this one.  Since they felt the allergy may have been from the levothyroxine thyroid medication.  Nothing concerning on the labs from my end.   Thx.   Dr. Lovena Le

## 2020-06-06 NOTE — Telephone Encounter (Signed)
Daughter notified of Dr. Tanna Furry message.

## 2020-06-06 NOTE — Telephone Encounter (Signed)
Angel Russell referred pt to allergist on 04/01/20. Dr gallagher's bw is in epic.

## 2020-06-06 NOTE — Telephone Encounter (Signed)
Left message to return call 

## 2020-06-06 NOTE — Telephone Encounter (Signed)
Pt wants to know if she needs to be seen or it Dr Lovena Le can look over her blood work that was recently done for [place] in [place] results was posted on [facility]   Pt call back [phone]

## 2020-06-08 LAB — COMPLEMENT COMPONENT C1Q: Complement C1Q: 15.3 mg/dL (ref 10.3–20.5)

## 2020-06-08 LAB — CMP14+EGFR
ALT: 16 IU/L (ref 0–32)
AST: 20 IU/L (ref 0–40)
Albumin/Globulin Ratio: 1.5 (ref 1.2–2.2)
Albumin: 4.3 g/dL (ref 3.8–4.8)
Alkaline Phosphatase: 102 IU/L (ref 44–121)
BUN/Creatinine Ratio: 16 (ref 12–28)
BUN: 13 mg/dL (ref 8–27)
Bilirubin Total: <0.2 mg/dL (ref 0.0–1.2)
CO2: 25 mmol/L (ref 20–29)
Calcium: 9.4 mg/dL (ref 8.7–10.3)
Chloride: 102 mmol/L (ref 96–106)
Creatinine, Ser: 0.81 mg/dL (ref 0.57–1.00)
GFR calc Af Amer: 85 mL/min/{1.73_m2} (ref >59)
GFR calc non Af Amer: 74 mL/min/{1.73_m2} (ref >59)
Globulin, Total: 2.9 g/dL (ref 1.5–4.5)
Glucose: 92 mg/dL (ref 65–99)
Potassium: 4.6 mmol/L (ref 3.5–5.2)
Sodium: 139 mmol/L (ref 134–144)
Total Protein: 7.2 g/dL (ref 6.0–8.5)

## 2020-06-08 LAB — CBC WITH DIFFERENTIAL/PLATELET
Basophils Absolute: 0 10*3/uL (ref 0.0–0.2)
Basos: 1 %
EOS (ABSOLUTE): 0.8 10*3/uL — ABNORMAL HIGH (ref 0.0–0.4)
Eos: 14 %
Hematocrit: 43.9 % (ref 34.0–46.6)
Hemoglobin: 14.5 g/dL (ref 11.1–15.9)
Immature Grans (Abs): 0 10*3/uL (ref 0.0–0.1)
Immature Granulocytes: 0 %
Lymphocytes Absolute: 1.5 10*3/uL (ref 0.7–3.1)
Lymphs: 26 %
MCH: 29 pg (ref 26.6–33.0)
MCHC: 33 g/dL (ref 31.5–35.7)
MCV: 88 fL (ref 79–97)
Monocytes Absolute: 0.4 10*3/uL (ref 0.1–0.9)
Monocytes: 7 %
Neutrophils Absolute: 3.1 10*3/uL (ref 1.4–7.0)
Neutrophils: 52 %
Platelets: 277 10*3/uL (ref 150–450)
RBC: 5 x10E6/uL (ref 3.77–5.28)
RDW: 14.7 % (ref 11.7–15.4)
WBC: 5.8 10*3/uL (ref 3.4–10.8)

## 2020-06-08 LAB — SEDIMENTATION RATE: Sed Rate: 3 mm/hr (ref 0–40)

## 2020-06-08 LAB — C1 ESTERASE INHIBITOR: C1INH SerPl-mCnc: 27 mg/dL (ref 21–39)

## 2020-06-08 LAB — C-REACTIVE PROTEIN: CRP: 2 mg/L (ref 0–10)

## 2020-06-08 LAB — CHRONIC URTICARIA: cu index: <2.6 (ref <10)

## 2020-06-08 LAB — TRYPTASE: Tryptase: 18 ug/L — ABNORMAL HIGH (ref 2.2–13.2)

## 2020-06-08 LAB — ANA W/REFLEX IF POSITIVE: Anti Nuclear Antibody (ANA): NEGATIVE

## 2020-06-08 LAB — C1 ESTERASE INHIBITOR, FUNCTIONAL: C1INH Functional/C1INH Total MFr SerPl: >91 %mean normal

## 2020-06-10 ENCOUNTER — Other Ambulatory Visit: Payer: Self-pay | Admitting: Nurse Practitioner

## 2020-06-16 NOTE — Addendum Note (Signed)
Addended by: Valentina Shaggy on: 06/16/2020 11:57 PM   Modules accepted: Orders

## 2020-06-18 ENCOUNTER — Encounter: Payer: Self-pay | Admitting: Nurse Practitioner

## 2020-06-18 NOTE — Telephone Encounter (Signed)
Mychart message sent.

## 2020-06-20 DIAGNOSIS — R748 Abnormal levels of other serum enzymes: Secondary | ICD-10-CM | POA: Diagnosis not present

## 2020-06-21 LAB — TRYPTASE: Tryptase: 22.1 ug/L — ABNORMAL HIGH (ref 2.2–13.2)

## 2020-06-24 ENCOUNTER — Other Ambulatory Visit: Payer: Self-pay

## 2020-06-24 ENCOUNTER — Encounter: Payer: Self-pay | Admitting: Family

## 2020-06-24 ENCOUNTER — Ambulatory Visit (INDEPENDENT_AMBULATORY_CARE_PROVIDER_SITE_OTHER): Payer: Medicaid Other | Admitting: Family

## 2020-06-24 VITALS — BP 118/78 | HR 73 | Temp 97.3°F | Resp 14 | Ht 65.35 in | Wt 207.6 lb

## 2020-06-24 DIAGNOSIS — R748 Abnormal levels of other serum enzymes: Secondary | ICD-10-CM | POA: Diagnosis not present

## 2020-06-24 DIAGNOSIS — R0602 Shortness of breath: Secondary | ICD-10-CM

## 2020-06-24 DIAGNOSIS — T7840XD Allergy, unspecified, subsequent encounter: Secondary | ICD-10-CM

## 2020-06-24 MED ORDER — ALBUTEROL SULFATE HFA 108 (90 BASE) MCG/ACT IN AERS: 2.0000 | INHALATION_SPRAY | RESPIRATORY_TRACT | 1 refills | Status: DC | PRN

## 2020-06-24 NOTE — Patient Instructions (Signed)
Allergic reaction We will call your family doctor to have them send in a prescription for Synthroid. Do not take this medication until you see [place] for the challenge. Make sure to bring the Synthroid with you the day of the challenge. We will get some additional lab work to follow up on your elevated tryptase level. We are wanting to rule out mastocytosis Make sure to keep your EpiPen on you at all times  Shortness of breath Start albuterol 2 puffs every 4 hours as needed for cough, wheeze, tightness in chest, or shortness of breath  Please let us know if this treatment plan is not working well for you. Schedule an oral challenge to Synthroid in a week or two. Please remember to remain off all antihistamines 3 days prior

## 2020-06-24 NOTE — Progress Notes (Signed)
[address], [address] [place] [address] Dept: [phone]  FOLLOW UP NOTE  Patient ID: Angel Russell, female    DOB: Jul 13, 1950  Age: 70 y.o. MRN: [id number] Date of Office Visit: 06/24/2020  Assessment  Chief Complaint: Food/Drug Challenge (Synthroid)  HPI Angel Russell is a 70 year old female who presents today for oral challenge to Synthroid.  She was last seen on November 29, 2020 by Dr. Ernst Bowler for allergic reaction.  An interpreter is here with her today and helps provide history.  She reports that when she took levothyroxine for 1 week she began having" allergies".  She had a red spot on her leg and it spread over her entire body she had problems breathing because it was itching so much it was very swollen and she reports that he had swelling over her entire body.  During this event she denies taking any NSAIDs,having a viral illness, or any alcohol consumption.  Unfortunately she does not have Synthroid with her today to complete the oral challenge.  It seems that this was not prescribed to her.Since her last office visit she reports that she has not had any episodes of urticaria or angioedema.  She has not been on levothyroxine since her last office visit.  She continues to carry her epinephrine autoinjector with her and this is up-to-date.  She has no family history of blood cancer, anaphylaxis, urticaria, or atopy.  She reports that when she is stung by a bee or has a tick bite that she just has large local swelling and the area is itchy.  She denies any concomitant cardiorespiratory or gastrointestinal symptoms.  Her screening exams are up-to-date.  She had a mammogram approximately 3 months ago and a colonoscopy last year.  She reports shortness of breath at times.  She denies any coughing, wheezing or tightness in her chest.  She does not have an albuterol inhaler.  Drug Allergies:  Allergies  Allergen Reactions  . Iodine Rash    Review of Systems: Review of  Systems  Constitutional: Negative for chills and fever.  HENT:       Denies rhinorrhea, nasal congestion, and post nasal drip  Eyes:       Denies itchy watery eyes  Respiratory: Positive for shortness of breath. Negative for cough and wheezing.        Reports shortness of breath at times  Cardiovascular: Negative for chest pain and palpitations.  Gastrointestinal: Positive for heartburn. Negative for abdominal pain.       Reports occasional heartburn- controls with diet   Genitourinary: Positive for dysuria.  Skin: Negative for itching and rash.  Neurological: Positive for headaches.       Reports headaches with weather changes     Physical Exam: BP 118/78   Pulse 73   Temp (!) 97.3 F (36.3 C)   Resp 14   Ht 5' 5.35" (1.66 m)   Wt 207 lb 9.6 oz (94.2 kg)   SpO2 98%   BMI 34.17 kg/m    Physical Exam Constitutional:      Appearance: Normal appearance.  HENT:     Head: Normocephalic and atraumatic.     Right Ear: Tympanic membrane, ear canal and external ear normal.     Left Ear: Tympanic membrane, ear canal and external ear normal.     Nose: Nose normal.     Mouth/Throat:     Mouth: Mucous membranes are moist.     Pharynx: Oropharynx is clear.  Eyes:  Conjunctiva/sclera: Conjunctivae normal.  Cardiovascular:     Rate and Rhythm: Normal rate and regular rhythm.     Heart sounds: Normal heart sounds.  Pulmonary:     Effort: Pulmonary effort is normal.     Breath sounds: Normal breath sounds.     Comments: Lungs clear to auscultation Musculoskeletal:     Cervical back: Neck supple.  Skin:    General: Skin is warm.     Comments: No rashes or urticarial lesions noted  Neurological:     Mental Status: She is oriented to person, place, and time.  Psychiatric:        Mood and Affect: Mood normal.        Behavior: Behavior normal.        Judgment: Judgment normal.     Diagnostics: FVC 2.33 L, FEV1 2.00 L.  Predicted FVC 2.71 L, FEV1 2.12 L.  Spirometry  indicates normal ventilatory function.  Assessment and Plan: 1. Allergic reaction, subsequent encounter   2. Elevated serum tryptase   3. Shortness of breath     Meds ordered this encounter  Medications  . albuterol (VENTOLIN HFA) 108 (90 Base) MCG/ACT inhaler    Sig: Inhale 2 puffs into the lungs every 4 (four) hours as needed for wheezing or shortness of breath.    Dispense:  8 g    Refill:  1    Patient Instructions  Allergic reaction We will call your family doctor to have them send in a prescription for Synthroid. Do not take this medication until you see [place] for the challenge. Make sure to bring the Synthroid with you the day of the challenge. We will get some additional lab work to follow up on your elevated tryptase level. We are wanting to rule out mastocytosis Make sure to keep your EpiPen on you at all times  Shortness of breath Start albuterol 2 puffs every 4 hours as needed for cough, wheeze, tightness in chest, or shortness of breath  Please let us know if this treatment plan is not working well for you. Schedule an oral challenge to Synthroid in a week or two. Please remember to remain off all antihistamines 3 days prior   Return in about 1 week (around 07/01/2020), or if symptoms worsen or fail to improve, for Synthroid drug challenge.    Thank you for the opportunity to care for this patient.  Please do not hesitate to contact me with questions.  Althea Charon, FNP [facility] [place]

## 2020-06-27 ENCOUNTER — Encounter: Payer: Self-pay | Admitting: Nurse Practitioner

## 2020-06-27 ENCOUNTER — Other Ambulatory Visit: Payer: Self-pay | Admitting: Nurse Practitioner

## 2020-06-27 MED ORDER — LEVOTHYROXINE SODIUM 50 MCG PO TABS: 50.0000 ug | ORAL_TABLET | Freq: Every day | ORAL | 0 refills | Status: DC

## 2020-06-29 ENCOUNTER — Ambulatory Visit: Payer: Medicaid Other | Admitting: Allergy & Immunology

## 2020-06-30 NOTE — Progress Notes (Signed)
[address] [place] [address] Dept: [phone]  FOLLOW UP NOTE  Patient ID: Angel Russell, female    DOB: [birth year]  Age: 70 y.o. MRN: [id number] Date of Office Visit: 07/01/2020  Assessment  Chief Complaint: Food/Drug Challenge (synthroid)  HPI Angel Russell is a 70 year old female who presents to the clinic for follow-up with drug challenge.  She was last seen in this clinic on 06/24/2020 by Dr. Ernst Bowler for evaluation of shortness of breath, reaction to thyroid medication generic levothyroxine, and an elevated tryptase.  She reports that after taking the generic levothyroxine for several days she noticed a red spot on her leg accompanied by itching and swelling.  The symptoms started abruptly after she stopped taking the generic levothyroxine.  She is accompanied by her daughter who assists with history.  At today's visit, she reports she is feeling well with no cardiopulmonary, integumentary, or gastrointestinal symptoms.  She brings with her today Synthroid 50 mcg for the drug challenge.  There is a Ecologist available throughout this visit.  Her current medications are listed in the chart.   Drug Allergies:  Allergies  Allergen Reactions  . Iodine Rash    Physical Exam: BP 110/72   Pulse 75   Temp (!) 97.2 F (36.2 C)   Resp 16   Ht 5\' 6"  (1.676 m)   Wt 206 lb 12.8 oz (93.8 kg)   SpO2 95%   BMI 33.38 kg/m    Physical Exam Vitals reviewed.  Constitutional:      Appearance: Normal appearance.  HENT:     Head: Normocephalic and atraumatic.     Right Ear: Tympanic membrane normal.     Left Ear: Tympanic membrane normal.     Nose:     Comments: Bilateral nares slightly erythematous with clear nasal drainage noted.  Slight septal deviation noted.  Pharynx normal.  Ears normal.  Eyes normal.    Mouth/Throat:     Pharynx: Oropharynx is clear.  Eyes:     Conjunctiva/sclera: Conjunctivae normal.  Cardiovascular:     Rate and Rhythm: Normal  rate and regular rhythm.     Heart sounds: Normal heart sounds. No murmur heard.   Pulmonary:     Effort: Pulmonary effort is normal.     Breath sounds: Normal breath sounds.     Comments: Lungs clear to auscultation Musculoskeletal:        General: Normal range of motion.     Cervical back: Normal range of motion and neck supple.  Skin:    General: Skin is warm and dry.  Neurological:     Mental Status: She is alert and oriented to person, place, and time.  Psychiatric:        Mood and Affect: Mood normal.        Behavior: Behavior normal.        Thought Content: Thought content normal.        Judgment: Judgment normal.     Diagnostics: FVC 3.25, FEV1 2.38.  Predicted FVC 2.73, predicted FEV1 2.29.  Spirometry indicates normal ventilatory function.  Procedure note Written consent obtained Open graded Synthroid oral challenge: The patient was able to tolerate the challenge today without adverse signs or symptoms. Vital signs were stable throughout the challenge and observation period. She received 2 doses separated by 30 minutes, each of which was separated by vitals and a brief physical exam. She received the following doses: 1/4 tablet and 3/4 tablet for a total dose of 50 mcg.  She was monitored for 60 minutes following the last dose.   The patient was able to tolerate the open graded oral challenge today without adverse signs or symptoms. Therefore, she has the same risk of systemic reaction associated with the consumption of Synthroid as the general population.  Assessment and Plan: 1. Allergic reaction, subsequent encounter   2. Shortness of breath     Patient Instructions  Drug challenge- in office Synthroid challenge [name] was able to tolerate the Synthroid oral challenge today at the office without adverse signs or symptoms of an allergic reaction. Therefore, she has the same risk of systemic reaction associated with the consumption of Synthroid as the general  population.  - Do not give any Synthroid  for the next 24 hours. - Monitor for allergic symptoms such as rash, wheezing, diarrhea, swelling, and vomiting for the next 24 hours. If severe symptoms occur, treat with EpiPen injection and call 911. For less severe symptoms treat with Benadryl 4 teaspoonfuls every 4 hours and call the clinic. On call doctor is available at all times - Contact your primary care provider for further dosing instructions for Synthroid  - We will call you with the lab results as soon as they become available  Call the clinic if this treatment plan is not working well for you  Follow up in 3 months or sooner if needed.   Return in about 3 months (around 09/28/2020), or if symptoms worsen or fail to improve.    Thank you for the opportunity to care for this patient.  Please do not hesitate to contact me with questions.  Gareth Morgan, FNP [facility] [place] [facility] [place]

## 2020-07-01 ENCOUNTER — Other Ambulatory Visit: Payer: Self-pay

## 2020-07-01 ENCOUNTER — Telehealth: Payer: Self-pay

## 2020-07-01 ENCOUNTER — Ambulatory Visit (INDEPENDENT_AMBULATORY_CARE_PROVIDER_SITE_OTHER): Payer: Medicaid Other | Admitting: Family Medicine

## 2020-07-01 ENCOUNTER — Encounter: Payer: Self-pay | Admitting: Family Medicine

## 2020-07-01 VITALS — BP 110/72 | HR 75 | Temp 97.2°F | Resp 16 | Ht 66.0 in | Wt 206.8 lb

## 2020-07-01 DIAGNOSIS — L500 Allergic urticaria: Secondary | ICD-10-CM

## 2020-07-01 DIAGNOSIS — Z888 Allergy status to other drugs, medicaments and biological substances status: Secondary | ICD-10-CM | POA: Diagnosis not present

## 2020-07-01 DIAGNOSIS — R0602 Shortness of breath: Secondary | ICD-10-CM

## 2020-07-01 DIAGNOSIS — L299 Pruritus, unspecified: Secondary | ICD-10-CM | POA: Diagnosis not present

## 2020-07-01 DIAGNOSIS — T7840XD Allergy, unspecified, subsequent encounter: Secondary | ICD-10-CM | POA: Diagnosis not present

## 2020-07-01 DIAGNOSIS — E039 Hypothyroidism, unspecified: Secondary | ICD-10-CM

## 2020-07-01 NOTE — Telephone Encounter (Signed)
Just had her allergy specialist wants from Dr when to start her on her thyroid med the doctor that she saw today recommends she starts on Monday. Dr Gareth Morgan [phone] [facility] referred   Pt call back [phone] [facility]

## 2020-07-01 NOTE — Patient Instructions (Addendum)
Drug challenge- in office Synthroid challenge Angel Russell was able to tolerate the Synthroid oral challenge today at the office without adverse signs or symptoms of an allergic reaction. Therefore, she has the same risk of systemic reaction associated with the consumption of Synthroid as the general population.  - Do not give any Synthroid  for the next 24 hours. - Monitor for allergic symptoms such as rash, wheezing, diarrhea, swelling, and vomiting for the next 24 hours. If severe symptoms occur, treat with EpiPen injection and call 911. For less severe symptoms treat with Benadryl 4 teaspoonfuls every 4 hours and call the clinic. On call doctor is available at all times - Contact your primary care provider for further dosing instructions for Synthroid  - We will call you with the lab results as soon as they become available  Call the clinic if this treatment plan is not working well for you  Follow up in 3 months or sooner if needed.

## 2020-07-07 LAB — MPN W/HYPEREOSINOPHILIA FISH

## 2020-07-07 LAB — C-KIT MUTATION, LIQUID TUMOR

## 2020-07-07 LAB — N-METHYLHISTAMINE, 24 HR, U
Collection Duration: 24 h
Creatinine Concent. 24 Hr, U: 79 mg/dL
Creatinine, 24 Hour, U: 751 mg/24 h (ref 603–1783)
N-Methylhistamine, 24 Hr, U: 95 mcg/g Cr (ref 30–200)
Urine Volume: 950 mL

## 2020-07-08 NOTE — Addendum Note (Signed)
Addended by: Vicente Males on: 07/08/2020 11:09 AM   Modules accepted: Orders

## 2020-07-08 NOTE — Telephone Encounter (Signed)
I reviewed the allergy specialist notes. It looks like she can tolerate the name brand thyroid medicine without difficulty. Please order repeat TSH to be done after she has been on thyroid med for about 2 months to check to see if this dose is correct. Thanks.

## 2020-07-08 NOTE — Telephone Encounter (Signed)
Left message to return call. Lab orders placed and mailed to patient with note to have done after being on med for 2 months.

## 2020-07-08 NOTE — Telephone Encounter (Signed)
Pt daughter returned call and verbalized understanding.  

## 2020-07-12 ENCOUNTER — Telehealth: Payer: Self-pay

## 2020-07-12 LAB — OTHER LAB TEST

## 2020-07-12 NOTE — Telephone Encounter (Signed)
-----   Message from Althea Charon, [place] sent at 07/12/2020 12:37 PM EST ----- Please call and schedule the patient for an Synthroid oral challenge in [place].  I am not sure why this was not scheduled after her last appointment.Please make sure that she brings Synthroid with her to the challenge. She will need to be off all antihistamines 3 days prior to this appointment. Also,have the patient make sure that her prescription is for brand name Synthroid.  Thank you, Althea Charon, FNP.

## 2020-07-12 NOTE — Telephone Encounter (Signed)
Can we get her scheduled. Synthroid was sent in 06/27/2020

## 2020-07-14 NOTE — Telephone Encounter (Signed)
Hey There,  I am a little confused on the appointment she had on 07/01/2020. Was this appointment with Gareth Morgan, NP not a Synthroid Oral Challenge?   Thanks

## 2020-07-15 NOTE — Telephone Encounter (Signed)
Yes, I am sorry that was a Synthroid oral challenge on 07/01/2020.. I just looked at her future appointments and saw that there was nothing scheduled.

## 2020-07-20 ENCOUNTER — Encounter: Payer: Self-pay | Admitting: Allergy & Immunology

## 2020-08-02 ENCOUNTER — Other Ambulatory Visit: Payer: Self-pay | Admitting: Nurse Practitioner

## 2020-08-03 ENCOUNTER — Other Ambulatory Visit: Payer: Self-pay | Admitting: *Deleted

## 2020-08-03 DIAGNOSIS — E039 Hypothyroidism, unspecified: Secondary | ICD-10-CM

## 2020-08-03 NOTE — Telephone Encounter (Signed)
Bw orders put in and mailed to pt to do end of april

## 2020-08-11 ENCOUNTER — Ambulatory Visit (INDEPENDENT_AMBULATORY_CARE_PROVIDER_SITE_OTHER): Payer: Medicaid Other | Admitting: Family Medicine

## 2020-08-11 ENCOUNTER — Encounter: Payer: Self-pay | Admitting: Family Medicine

## 2020-08-11 ENCOUNTER — Other Ambulatory Visit: Payer: Self-pay

## 2020-08-11 VITALS — BP 110/80 | HR 92 | Temp 97.4°F | Ht 66.0 in | Wt 204.0 lb

## 2020-08-11 DIAGNOSIS — K130 Diseases of lips: Secondary | ICD-10-CM | POA: Diagnosis not present

## 2020-08-11 DIAGNOSIS — L989 Disorder of the skin and subcutaneous tissue, unspecified: Secondary | ICD-10-CM | POA: Diagnosis not present

## 2020-08-11 MED ORDER — HYDROCORTISONE 2.5 % EX CREA: TOPICAL_CREAM | Freq: Two times a day (BID) | CUTANEOUS | 0 refills | Status: DC

## 2020-08-11 NOTE — Progress Notes (Signed)
Patient ID: Angel Russell, female    DOB: 10-07-1950, 70 y.o.   MRN: [id number]   Chief Complaint  Patient presents with  . growth on nose    Has noticed 2 months ago with bleeding with scratching  . crack on lip    Has noticed it about 2 months ago    Subjective:    HPI  Pt seen today with interpreter.  CC-Small skin tag area on the rt nare and lip cracking. occ touches it will start bleeding on the rt nose. 2 months has noticed this.  Crack on left lower lip 2-3 months. Cracking and not healing. Has tried lip balm w/o relief.  Hypothyroid- and seeing specialist for this. Had an allergic reaction and now on synthroid and not having any allergies.  Medical History Angel Russell has a past medical history of Arthritis, Family history of bladder problems, H pylori ulcer, Labial cyst, Ovarian cancer ([place]), and Ulcer of small intestine.   Outpatient Encounter Medications as of 08/11/2020  Medication Sig  . hydrocortisone 2.5 % cream Apply topically 2 (two) times daily. Apply to lower lip or 1 wk.  . ibuprofen (ADVIL,MOTRIN) 200 MG tablet Take 200 mg by mouth every 4 (four) hours as needed.  . naproxen (NAPROSYN) 500 MG tablet Take 1 tablet (500 mg total) by mouth 2 (two) times daily with a meal. Prn pain  . SYNTHROID 50 MCG tablet TAKE 1 TABLET BY MOUTH ONCE DAILY.  Marland Kitchen EPINEPHrine 0.3 mg/0.3 mL IJ SOAJ injection Inject 0.3 mg into the muscle as needed for anaphylaxis. (Patient not taking: Reported on 08/11/2020)  . glucosamine-chondroitin 500-400 MG tablet Take 1 tablet by mouth 3 (three) times daily. (Patient not taking: Reported on 08/11/2020)  . hydrocortisone-pramoxine (PROCTOFOAM-HC) rectal foam Place 1 applicator rectally 2 (two) times daily. (Patient not taking: No sig reported)  . OVER THE COUNTER MEDICATION Fish oil magnessium calcium (Patient not taking: Reported on 08/11/2020)  . [DISCONTINUED] albuterol (VENTOLIN HFA) 108 (90 Base) MCG/ACT inhaler Inhale 2 puffs into the lungs  every 4 (four) hours as needed for wheezing or shortness of breath.   No facility-administered encounter medications on file as of 08/11/2020.     Review of Systems  Constitutional: Negative for chills and fever.  HENT: Negative for congestion, rhinorrhea and sore throat.   Respiratory: Negative for cough, shortness of breath and wheezing.   Cardiovascular: Negative for chest pain and leg swelling.  Gastrointestinal: Negative for abdominal pain, diarrhea, nausea and vomiting.  Genitourinary: Negative for dysuria and frequency.  Musculoskeletal: Negative for arthralgias and back pain.  Skin: Negative for rash.       +skin lesion rt nare and left lower lip cracking.  Neurological: Negative for dizziness, weakness and headaches.     Vitals BP 110/80   Pulse 92   Temp (!) 97.4 F (36.3 C)   Ht 5\' 6"  (1.676 m)   Wt 204 lb (92.5 kg)   SpO2 96%   BMI 32.93 kg/m   Objective:   Physical Exam Vitals and nursing note reviewed.  Constitutional:      General: She is not in acute distress.    Appearance: Normal appearance. She is not ill-appearing.  HENT:     Nose: No congestion or rhinorrhea.     Comments: +small punctate skin tag on the rt lateral nare.  Erythema, no bleeding.    Mouth/Throat:     Mouth: Mucous membranes are dry.     Pharynx: No oropharyngeal exudate or posterior  oropharyngeal erythema.     Comments: Left lower lip with a deep fissure, not healing, no bleeding or scabbing. Eyes:     Extraocular Movements: Extraocular movements intact.     Conjunctiva/sclera: Conjunctivae normal.     Pupils: Pupils are equal, round, and reactive to light.  Neurological:     General: No focal deficit present.     Mental Status: She is alert and oriented to person, place, and time.  Psychiatric:        Mood and Affect: Mood normal.        Behavior: Behavior normal.      Assessment and Plan   1. Lesion of skin of nose - Ambulatory referral to Dermatology  2. Cracked  lip - Ambulatory referral to Dermatology - hydrocortisone 2.5 % cream; Apply topically 2 (two) times daily. Apply to lower lip or 1 wk.  Dispense: 30 g; Refill: 0   Lower lip cracking- Pt to use hydrocortisone on the lower lip and vaseline prn.  Rt nare-skin tag- f/u with derm for removal.  Pt in agreement.  Return if symptoms worsen or fail to improve.

## 2020-08-24 ENCOUNTER — Other Ambulatory Visit: Payer: Self-pay | Admitting: Nurse Practitioner

## 2020-08-25 DIAGNOSIS — C44311 Basal cell carcinoma of skin of nose: Secondary | ICD-10-CM | POA: Diagnosis not present

## 2020-08-25 DIAGNOSIS — L821 Other seborrheic keratosis: Secondary | ICD-10-CM | POA: Diagnosis not present

## 2020-08-25 DIAGNOSIS — L219 Seborrheic dermatitis, unspecified: Secondary | ICD-10-CM | POA: Diagnosis not present

## 2020-08-25 DIAGNOSIS — Z0120 Encounter for dental examination and cleaning without abnormal findings: Secondary | ICD-10-CM | POA: Diagnosis not present

## 2020-08-25 DIAGNOSIS — L918 Other hypertrophic disorders of the skin: Secondary | ICD-10-CM | POA: Diagnosis not present

## 2020-09-15 DIAGNOSIS — Z0120 Encounter for dental examination and cleaning without abnormal findings: Secondary | ICD-10-CM | POA: Diagnosis not present

## 2020-09-30 ENCOUNTER — Other Ambulatory Visit: Payer: Self-pay | Admitting: Nurse Practitioner

## 2020-09-30 ENCOUNTER — Telehealth: Payer: Self-pay

## 2020-09-30 NOTE — Telephone Encounter (Signed)
Error

## 2020-09-30 NOTE — Telephone Encounter (Signed)
Pt needs to recheck tsh, when can she get labs? Dr. Lovena Le

## 2020-09-30 NOTE — Telephone Encounter (Signed)
Lab Results  Component Value Date   TSH 4.750 (H) 03/04/2020

## 2020-09-30 NOTE — Telephone Encounter (Signed)
Lab in computer already. Thx. Dr. Lovena Le

## 2020-10-06 ENCOUNTER — Ambulatory Visit: Payer: Medicaid Other | Admitting: Family Medicine

## 2020-10-12 ENCOUNTER — Telehealth: Payer: Self-pay

## 2020-10-12 DIAGNOSIS — E039 Hypothyroidism, unspecified: Secondary | ICD-10-CM | POA: Diagnosis not present

## 2020-10-12 NOTE — Telephone Encounter (Signed)
Error

## 2020-10-13 LAB — TSH: TSH: 1.82 u[IU]/mL (ref 0.450–4.500)

## 2020-10-16 ENCOUNTER — Encounter: Payer: Self-pay | Admitting: Nurse Practitioner

## 2020-10-18 ENCOUNTER — Ambulatory Visit: Payer: Medicaid Other | Admitting: Family Medicine

## 2020-10-28 ENCOUNTER — Other Ambulatory Visit: Payer: Self-pay | Admitting: Nurse Practitioner

## 2020-11-29 ENCOUNTER — Other Ambulatory Visit: Payer: Self-pay | Admitting: Family Medicine

## 2020-12-22 ENCOUNTER — Other Ambulatory Visit: Payer: Self-pay | Admitting: Nurse Practitioner

## 2020-12-22 MED ORDER — NIRMATRELVIR/RITONAVIR (PAXLOVID)TABLET: 3.0000 | ORAL_TABLET | Freq: Two times a day (BID) | ORAL | 0 refills | Status: AC

## 2020-12-22 NOTE — Progress Notes (Signed)
Phone contact from patient's daughter. Patient does not speak English so she served as a Optometrist per patient request.  Several contacts have had [place]. She has had 2 vaccines and one booster. Patient began having symptoms including headache. No difficulty breathing. Taking fluids well.  Paxlovid ordered based on risks including age.  Warning signs reviewed. Monitor BP. Contact office or go to Urgent Care if any problems.

## 2021-01-02 ENCOUNTER — Other Ambulatory Visit: Payer: Self-pay | Admitting: Family Medicine

## 2021-01-03 ENCOUNTER — Telehealth: Payer: Self-pay | Admitting: Family Medicine

## 2021-01-03 ENCOUNTER — Other Ambulatory Visit: Payer: Self-pay | Admitting: Nurse Practitioner

## 2021-01-03 NOTE — Telephone Encounter (Signed)
Patient needing labs done for medication follow up on 9/14

## 2021-01-03 NOTE — Telephone Encounter (Signed)
Last labs completed 10/12/20 TSH. Please advise. Thank you

## 2021-01-04 DIAGNOSIS — Z0120 Encounter for dental examination and cleaning without abnormal findings: Secondary | ICD-10-CM | POA: Diagnosis not present

## 2021-01-06 NOTE — Telephone Encounter (Signed)
Left detailed message on pt voicemail (per DPR)

## 2021-01-25 ENCOUNTER — Other Ambulatory Visit: Payer: Self-pay

## 2021-01-25 ENCOUNTER — Ambulatory Visit: Payer: Medicaid Other | Admitting: Family Medicine

## 2021-01-25 VITALS — BP 108/70 | HR 73 | Ht 66.0 in | Wt 204.4 lb

## 2021-01-25 DIAGNOSIS — E039 Hypothyroidism, unspecified: Secondary | ICD-10-CM

## 2021-01-25 MED ORDER — SYNTHROID 50 MCG PO TABS: 50.0000 ug | ORAL_TABLET | Freq: Every day | ORAL | 2 refills | Status: DC

## 2021-01-25 NOTE — Progress Notes (Signed)
Patient ID: Angel Russell, female    DOB: March 02, 1951, 70 y.o.   MRN: [id number]   Chief Complaint  Patient presents with   Hypothyroidism    Follow up   Subjective:    HPI Pt seen for hypothyroidism. Pt needing an interpreter, [place] interpreter.  Hypothyroidism- Not having any side effects from thyroid meds.  Compliant with meds. No diarrhea, constipation, depression/anxiety, palpitations, excessive hair loss or weight gain/loss. No heat/cold intolerance.  Medical History Angel Russell has a past medical history of Arthritis, Family history of bladder problems, H pylori ulcer, Labial cyst, Ovarian cancer ([place]), and Ulcer of small intestine.   Outpatient Encounter Medications as of 01/25/2021  Medication Sig   hydrocortisone 2.5 % cream Apply topically 2 (two) times daily. Apply to lower lip or 1 wk.   ibuprofen (ADVIL,MOTRIN) 200 MG tablet Take 200 mg by mouth every 4 (four) hours as needed.   naproxen (NAPROSYN) 500 MG tablet Take 1 tablet (500 mg total) by mouth 2 (two) times daily with a meal. Prn pain   SYNTHROID 50 MCG tablet Take 1 tablet (50 mcg total) by mouth daily.   [DISCONTINUED] EPINEPHrine 0.3 mg/0.3 mL IJ SOAJ injection Inject 0.3 mg into the muscle as needed for anaphylaxis. (Patient not taking: Reported on 08/11/2020)   [DISCONTINUED] SYNTHROID 50 MCG tablet TAKE 1 TABLET BY MOUTH ONCE DAILY.   No facility-administered encounter medications on file as of 01/25/2021.     Review of Systems  Constitutional:  Negative for chills and fever.  HENT:  Negative for congestion, rhinorrhea and sore throat.   Respiratory:  Negative for cough, shortness of breath and wheezing.   Cardiovascular:  Negative for chest pain and leg swelling.  Gastrointestinal:  Negative for abdominal pain, diarrhea, nausea and vomiting.  Endocrine: Negative for cold intolerance and heat intolerance.  Genitourinary:  Negative for dysuria and frequency.  Musculoskeletal:  Negative for  arthralgias and back pain.  Skin:  Negative for rash.  Neurological:  Negative for dizziness, weakness and headaches.    Vitals BP 108/70   Pulse 73   Ht '5\' 6"'$  (1.676 m)   Wt 204 lb 6.4 oz (92.7 kg)   BMI 32.99 kg/m   Objective:   Physical Exam Vitals and nursing note reviewed.  Constitutional:      Appearance: Normal appearance.  HENT:     Head: Normocephalic and atraumatic.     Nose: Nose normal.     Mouth/Throat:     Mouth: Mucous membranes are moist.     Pharynx: Oropharynx is clear.  Eyes:     Extraocular Movements: Extraocular movements intact.     Conjunctiva/sclera: Conjunctivae normal.     Pupils: Pupils are equal, round, and reactive to light.  Cardiovascular:     Rate and Rhythm: Normal rate and regular rhythm.     Pulses: Normal pulses.     Heart sounds: Normal heart sounds.  Pulmonary:     Effort: Pulmonary effort is normal.     Breath sounds: Normal breath sounds. No wheezing, rhonchi or rales.  Musculoskeletal:        General: Normal range of motion.     Right lower leg: No edema.     Left lower leg: No edema.  Skin:    General: Skin is warm and dry.     Findings: No lesion or rash.  Neurological:     General: No focal deficit present.     Mental Status: She is alert and oriented to person,  place, and time.  Psychiatric:        Mood and Affect: Mood normal.        Behavior: Behavior normal.     Assessment and Plan   1. Hypothyroidism, unspecified type - SYNTHROID 50 MCG tablet; Take 1 tablet (50 mcg total) by mouth daily.  Dispense: 90 tablet; Refill: 2   Stable. Doing well.  Cont this dose. Recheck labs next visit.  Return in about 6 months (around 07/25/2021).

## 2021-03-08 ENCOUNTER — Other Ambulatory Visit: Payer: Self-pay | Admitting: Nurse Practitioner

## 2021-03-08 DIAGNOSIS — E039 Hypothyroidism, unspecified: Secondary | ICD-10-CM

## 2021-03-09 ENCOUNTER — Other Ambulatory Visit (HOSPITAL_COMMUNITY): Payer: Self-pay | Admitting: Nurse Practitioner

## 2021-03-09 ENCOUNTER — Other Ambulatory Visit: Payer: Self-pay | Admitting: Nurse Practitioner

## 2021-03-09 ENCOUNTER — Other Ambulatory Visit (HOSPITAL_COMMUNITY): Payer: Self-pay | Admitting: Family Medicine

## 2021-03-09 DIAGNOSIS — Z1231 Encounter for screening mammogram for malignant neoplasm of breast: Secondary | ICD-10-CM

## 2021-03-09 DIAGNOSIS — E039 Hypothyroidism, unspecified: Secondary | ICD-10-CM

## 2021-03-23 DIAGNOSIS — Z0120 Encounter for dental examination and cleaning without abnormal findings: Secondary | ICD-10-CM | POA: Diagnosis not present

## 2021-04-03 ENCOUNTER — Ambulatory Visit (INDEPENDENT_AMBULATORY_CARE_PROVIDER_SITE_OTHER): Payer: Medicaid Other | Admitting: Family Medicine

## 2021-04-03 ENCOUNTER — Telehealth: Payer: Self-pay | Admitting: Family Medicine

## 2021-04-03 ENCOUNTER — Other Ambulatory Visit: Payer: Self-pay

## 2021-04-03 VITALS — BP 92/60 | HR 62 | Temp 96.8°F | Ht 64.5 in | Wt 198.8 lb

## 2021-04-03 DIAGNOSIS — M159 Polyosteoarthritis, unspecified: Secondary | ICD-10-CM | POA: Diagnosis not present

## 2021-04-03 DIAGNOSIS — E785 Hyperlipidemia, unspecified: Secondary | ICD-10-CM | POA: Diagnosis not present

## 2021-04-03 DIAGNOSIS — E039 Hypothyroidism, unspecified: Secondary | ICD-10-CM | POA: Diagnosis not present

## 2021-04-03 DIAGNOSIS — M150 Primary generalized (osteo)arthritis: Secondary | ICD-10-CM

## 2021-04-03 DIAGNOSIS — I959 Hypotension, unspecified: Secondary | ICD-10-CM | POA: Diagnosis not present

## 2021-04-03 DIAGNOSIS — Z130 Encounter for screening for diseases of the blood and blood-forming organs and certain disorders involving the immune mechanism: Secondary | ICD-10-CM

## 2021-04-03 MED ORDER — NAPROXEN 500 MG PO TABS: 500.0000 mg | ORAL_TABLET | Freq: Two times a day (BID) | ORAL | 3 refills | Status: DC | PRN

## 2021-04-03 MED ORDER — SYNTHROID 50 MCG PO TABS: 50.0000 ug | ORAL_TABLET | Freq: Every day | ORAL | 3 refills | Status: DC

## 2021-04-03 NOTE — Assessment & Plan Note (Signed)
Suspect that this is related to volume.  Advised to increase fluid intake and also increase salt intake. Labs today. Return in 2 weeks for repeat evaluation.

## 2021-04-03 NOTE — Patient Instructions (Signed)
I have refilled her medications.  Labs today.  Increase fluid intake and salt intake.  Follow up in 2 weeks for BP.  Take care  Dr. Lacinda Axon

## 2021-04-03 NOTE — Telephone Encounter (Signed)
Patient has physical this morning and wanting to get labs done today if possible.

## 2021-04-03 NOTE — Progress Notes (Signed)
Subjective:  Patient ID: Angel Russell, female    DOB: May 01, 1951  Age: 70 y.o. MRN: [id number]  CC: Arthritis, Low BP  HPI Angel Russell is a 70 y.o. female presents to the clinic today for evaluation of the above.  This was initially supposed to be an annual physical exam but patient has more than 1 complaint/concern today.  Low BP Patient and daughter report that she has had low blood pressure since having COVID-19.  She is often symptomatic and feels dizzy/lightheaded.  Sometimes feels off balance.  No reports of syncope. She drinks approximately 3 cups of tea daily.  No additional salt intake. BP today 92/60.  She is on no antihypertensives. Patient states she feels quite fatigued and has little energy.  Arthritis Patient has arthritis of multiple joints.  She is quite bothered by this. Mostly bothered by osteoarthritis of the hands and hip pain. She takes naproxen as needed.  She needs a refill.  Hypothyroidism Previously thought to have an allergic reaction to thyroid medication.  This was ruled out by allergy.  She is currently on 50 MCG daily.  Needs labs.  PMH, Surgical Hx, Family Hx, Social History reviewed and updated as below.  Past Medical History:  Diagnosis Date   Arthritis    Family history of bladder problems    H pylori ulcer    Labial cyst    Ovarian cancer ([place])    Ulcer of small intestine    Past Surgical History:  Procedure Laterality Date   APPENDECTOMY     COLONOSCOPY WITH PROPOFOL N/A 05/05/2018   Procedure: COLONOSCOPY WITH PROPOFOL;  Surgeon: Jonathon Bellows, MD;  [facility] ENDOSCOPY;  Service: Gastroenterology;  Laterality: N/A;   FOOT SURGERY     TOTAL ABDOMINAL HYSTERECTOMY W/ BILATERAL SALPINGOOPHORECTOMY     Family History  Problem Relation Age of Onset   Varicose Veins Father    Thyroid disease Son    Berenice Primas' disease Son    Social History   Tobacco Use   Smoking status: Never   Smokeless tobacco: Never  Substance Use Topics    Alcohol use: No    Alcohol/week: 0.0 standard drinks    Comment: 1-2 times per month    Review of Systems Per HPI  Objective:   Today's Vitals: BP 92/60   Pulse 62   Temp (!) 96.8 F (36 C)   Ht 5' 4.5" (1.638 m)   Wt 198 lb 12.8 oz (90.2 kg)   SpO2 99%   BMI 33.60 kg/m   Physical Exam Constitutional:      General: She is not in acute distress.    Appearance: Normal appearance. She is obese. She is not ill-appearing.  HENT:     Head: Normocephalic and atraumatic.     Mouth/Throat:     Mouth: Mucous membranes are dry.  Eyes:     General:        Right eye: No discharge.        Left eye: No discharge.     Conjunctiva/sclera: Conjunctivae normal.  Cardiovascular:     Rate and Rhythm: Normal rate and regular rhythm.     Heart sounds: No murmur heard. Pulmonary:     Effort: Pulmonary effort is normal.     Breath sounds: Normal breath sounds. No wheezing, rhonchi or rales.  Abdominal:     General: There is no distension.     Palpations: Abdomen is soft.     Tenderness: There is no abdominal tenderness.  Neurological:  Mental Status: She is alert.  Psychiatric:        Mood and Affect: Mood normal.        Behavior: Behavior normal.     Assessment & Plan:   Problem List Items Addressed This Visit       Cardiovascular and Mediastinum   Hypotension - Primary    Suspect that this is related to volume.  Advised to increase fluid intake and also increase salt intake. Labs today. Return in 2 weeks for repeat evaluation.      Relevant Orders   CMP14+EGFR     Endocrine   Hypothyroidism    Stable.  Continue Synthroid.  Labs today.      Relevant Medications   SYNTHROID 50 MCG tablet   Other Relevant Orders   TSH     Musculoskeletal and Integument   Primary osteoarthritis involving multiple joints    Naproxen as directed.  Use sparingly.      Relevant Medications   naproxen (NAPROSYN) 500 MG tablet     Other   Hyperlipidemia   Relevant Orders    Lipid panel   Other Visit Diagnoses     Screening for deficiency anemia       Relevant Orders   CBC       Meds ordered this encounter  Medications   naproxen (NAPROSYN) 500 MG tablet    Sig: Take 1 tablet (500 mg total) by mouth 2 (two) times daily as needed for moderate pain. Prn pain    Dispense:  30 tablet    Refill:  3   SYNTHROID 50 MCG tablet    Sig: Take 1 tablet (50 mcg total) by mouth daily.    Dispense:  90 tablet    Refill:  3     Follow-up: Return in about 2 weeks (around 04/17/2021), or BP check.  [place]

## 2021-04-03 NOTE — Assessment & Plan Note (Signed)
Stable.  Continue Synthroid.  Labs today.

## 2021-04-03 NOTE — Assessment & Plan Note (Signed)
Naproxen as directed.  Use sparingly.

## 2021-04-04 LAB — CBC
Hematocrit: 42.2 % (ref 34.0–46.6)
Hemoglobin: 14 g/dL (ref 11.1–15.9)
MCH: 30 pg (ref 26.6–33.0)
MCHC: 33.2 g/dL (ref 31.5–35.7)
MCV: 90 fL (ref 79–97)
Platelets: 261 10*3/uL (ref 150–450)
RBC: 4.67 x10E6/uL (ref 3.77–5.28)
RDW: 12.8 % (ref 11.7–15.4)
WBC: 5.2 10*3/uL (ref 3.4–10.8)

## 2021-04-04 LAB — CMP14+EGFR
ALT: 13 IU/L (ref 0–32)
AST: 20 IU/L (ref 0–40)
Albumin/Globulin Ratio: 1.7 (ref 1.2–2.2)
Albumin: 4.5 g/dL (ref 3.8–4.8)
Alkaline Phosphatase: 97 IU/L (ref 44–121)
BUN/Creatinine Ratio: 29 — ABNORMAL HIGH (ref 12–28)
BUN: 22 mg/dL (ref 8–27)
Bilirubin Total: 0.4 mg/dL (ref 0.0–1.2)
CO2: 24 mmol/L (ref 20–29)
Calcium: 9.6 mg/dL (ref 8.7–10.3)
Chloride: 102 mmol/L (ref 96–106)
Creatinine, Ser: 0.76 mg/dL (ref 0.57–1.00)
Globulin, Total: 2.7 g/dL (ref 1.5–4.5)
Glucose: 78 mg/dL (ref 70–99)
Potassium: 4.7 mmol/L (ref 3.5–5.2)
Sodium: 141 mmol/L (ref 134–144)
Total Protein: 7.2 g/dL (ref 6.0–8.5)
eGFR: 84 mL/min/{1.73_m2} (ref >59)

## 2021-04-04 LAB — LIPID PANEL
Chol/HDL Ratio: 3.2 ratio (ref 0.0–4.4)
Cholesterol, Total: 218 mg/dL — ABNORMAL HIGH (ref 100–199)
HDL: 69 mg/dL (ref >39)
LDL Chol Calc (NIH): 130 mg/dL — ABNORMAL HIGH (ref 0–99)
Triglycerides: 106 mg/dL (ref 0–149)
VLDL Cholesterol Cal: 19 mg/dL (ref 5–40)

## 2021-04-04 LAB — TSH: TSH: 2.61 u[IU]/mL (ref 0.450–4.500)

## 2021-04-05 ENCOUNTER — Other Ambulatory Visit: Payer: Self-pay | Admitting: Family Medicine

## 2021-04-05 MED ORDER — ROSUVASTATIN CALCIUM 10 MG PO TABS: 10.0000 mg | ORAL_TABLET | Freq: Every day | ORAL | 3 refills | Status: DC

## 2021-04-17 ENCOUNTER — Ambulatory Visit: Payer: Medicaid Other | Admitting: Family Medicine

## 2021-05-02 DIAGNOSIS — Z0120 Encounter for dental examination and cleaning without abnormal findings: Secondary | ICD-10-CM | POA: Diagnosis not present

## 2021-05-03 DIAGNOSIS — H524 Presbyopia: Secondary | ICD-10-CM | POA: Diagnosis not present

## 2021-05-03 DIAGNOSIS — H5213 Myopia, bilateral: Secondary | ICD-10-CM | POA: Diagnosis not present

## 2021-05-03 DIAGNOSIS — Z0120 Encounter for dental examination and cleaning without abnormal findings: Secondary | ICD-10-CM | POA: Diagnosis not present

## 2021-05-04 ENCOUNTER — Other Ambulatory Visit: Payer: Self-pay

## 2021-05-04 ENCOUNTER — Ambulatory Visit (HOSPITAL_COMMUNITY)
Admission: RE | Admit: 2021-05-04 | Discharge: 2021-05-04 | Disposition: A | Discharge status: Home / Self Care | Payer: Medicaid Other | Source: Ambulatory Visit | Attending: Nurse Practitioner | Admitting: Nurse Practitioner

## 2021-05-04 DIAGNOSIS — Z1231 Encounter for screening mammogram for malignant neoplasm of breast: Secondary | ICD-10-CM

## 2021-10-04 DIAGNOSIS — Z0120 Encounter for dental examination and cleaning without abnormal findings: Secondary | ICD-10-CM | POA: Diagnosis not present

## 2021-10-25 DIAGNOSIS — Z0120 Encounter for dental examination and cleaning without abnormal findings: Secondary | ICD-10-CM | POA: Diagnosis not present

## 2021-11-15 DIAGNOSIS — Z0120 Encounter for dental examination and cleaning without abnormal findings: Secondary | ICD-10-CM | POA: Diagnosis not present

## 2021-11-30 DIAGNOSIS — Z0120 Encounter for dental examination and cleaning without abnormal findings: Secondary | ICD-10-CM | POA: Diagnosis not present

## 2022-03-29 ENCOUNTER — Other Ambulatory Visit: Payer: Self-pay | Admitting: Family Medicine

## 2022-03-29 DIAGNOSIS — E039 Hypothyroidism, unspecified: Secondary | ICD-10-CM

## 2022-04-03 ENCOUNTER — Other Ambulatory Visit (HOSPITAL_COMMUNITY): Payer: Self-pay | Admitting: Family Medicine

## 2022-04-03 DIAGNOSIS — Z1231 Encounter for screening mammogram for malignant neoplasm of breast: Secondary | ICD-10-CM

## 2022-04-09 ENCOUNTER — Ambulatory Visit (INDEPENDENT_AMBULATORY_CARE_PROVIDER_SITE_OTHER): Payer: Medicaid Other | Admitting: Family Medicine

## 2022-04-09 VITALS — BP 118/74 | Ht 64.5 in | Wt 206.6 lb

## 2022-04-09 DIAGNOSIS — Z23 Encounter for immunization: Secondary | ICD-10-CM

## 2022-04-09 DIAGNOSIS — E785 Hyperlipidemia, unspecified: Secondary | ICD-10-CM

## 2022-04-09 DIAGNOSIS — Z130 Encounter for screening for diseases of the blood and blood-forming organs and certain disorders involving the immune mechanism: Secondary | ICD-10-CM

## 2022-04-09 DIAGNOSIS — Z0000 Encounter for general adult medical examination without abnormal findings: Secondary | ICD-10-CM | POA: Diagnosis not present

## 2022-04-09 DIAGNOSIS — E669 Obesity, unspecified: Secondary | ICD-10-CM | POA: Diagnosis not present

## 2022-04-09 DIAGNOSIS — E039 Hypothyroidism, unspecified: Secondary | ICD-10-CM

## 2022-04-09 MED ORDER — FLUOCINOLONE ACETONIDE 0.01 % OT OIL: 5.0000 [drp] | TOPICAL_OIL | Freq: Two times a day (BID) | OTIC | 0 refills | Status: DC | PRN

## 2022-04-09 NOTE — Patient Instructions (Signed)
Labs today.  I refilled the drop.  Follow up annually.  Take care  Dr. Lacinda Axon

## 2022-04-10 DIAGNOSIS — Z0000 Encounter for general adult medical examination without abnormal findings: Secondary | ICD-10-CM | POA: Insufficient documentation

## 2022-04-10 NOTE — Progress Notes (Signed)
Subjective:  Patient ID: Stephanny Tsutsui, female    DOB: [birth year]  Age: 71 y.o. MRN: [id number]  CC: Chief Complaint  Patient presents with   Annual Exam    No problems or concerns per patient- feeling ok- feels weather changes in hands and hips    HPI:  71 year old female with osteoarthritis, hypothyroidism, hyperlipidemia presents for an annual exam.  Patient is accompanied by interpreter as she speaks [place].  Patient's colonoscopy is up-to-date.  Scheduled for mammogram at the end of December.  Would like an influenza vaccine today.  Last pneumococcal vaccine was in 2019.  Unsure of shingles vaccine.  Patient states that she is doing fairly well.  She does state that she needs a refill on eyedrop that she uses for eczematous external otitis.  She uses this occasionally.  Patient's osteoarthritis is managed with naproxen.   Patient needs laboratory studies.  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    Social Hx   Social History   Socioeconomic History   Marital status: Married    Spouse name: Not on file   Number of children: Not on file   Years of education: Not on file   Highest education level: Not on file  Occupational History   Not on file  Tobacco Use   Smoking status: Never   Smokeless tobacco: Never  Substance and Sexual Activity   Alcohol use: No    Alcohol/week: 0.0 standard drinks of alcohol    Comment: 1-2 times per month   Drug use: No   Sexual activity: Never  Other Topics Concern   Not on file  Social History Narrative   Not on file   Social Determinants of Health   Financial Resource Strain: Not on file  Food Insecurity: Not on file  Transportation Needs: Not on file  Physical Activity: Not on file  Stress: Not on file  Social  Connections: Not on file    Review of Systems  Constitutional: Negative.   Musculoskeletal:  Positive for arthralgias.   Objective:  BP 118/74   Ht 5' 4.5" (1.638 m)   Wt 206 lb 9.6 oz (93.7 kg)   BMI 34.92 kg/m      04/09/2022    1:42 PM 04/03/2021   10:29 AM 01/25/2021    2:15 PM  BP/Weight  Systolic BP 673 92 419  Diastolic BP 74 60 70  Wt. (Lbs) 206.6 198.8 204.4  BMI 34.92 kg/m2 33.6 kg/m2 32.99 kg/m2    Physical Exam Vitals and nursing note reviewed.  Constitutional:      General: She is not in acute distress.    Appearance: Normal appearance. She is obese.  HENT:     Head: Normocephalic and atraumatic.     Mouth/Throat:     Pharynx: Oropharynx is clear.  Eyes:     General:        Right eye: No discharge.        Left eye: No discharge.     Conjunctiva/sclera: Conjunctivae normal.  Cardiovascular:     Rate and Rhythm: Normal rate and regular rhythm.  Pulmonary:     Effort: Pulmonary effort is normal.     Breath sounds: Normal breath sounds. No wheezing, rhonchi or rales.  Neurological:     Mental Status: She is alert.  Psychiatric:  Mood and Affect: Mood normal.        Behavior: Behavior normal.     Lab Results  Component Value Date   WBC 5.2 04/03/2021   HGB 14.0 04/03/2021   HCT 42.2 04/03/2021   PLT 261 04/03/2021   GLUCOSE 78 04/03/2021   CHOL 218 (H) 04/03/2021   TRIG 106 04/03/2021   HDL 69 04/03/2021   LDLCALC 130 (H) 04/03/2021   ALT 13 04/03/2021   AST 20 04/03/2021   NA 141 04/03/2021   K 4.7 04/03/2021   CL 102 04/03/2021   CREATININE 0.76 04/03/2021   BUN 22 04/03/2021   CO2 24 04/03/2021   TSH 2.610 04/03/2021     Assessment & Plan:   Problem List Items Addressed This Visit       Endocrine   Hypothyroidism   Relevant Orders   TSH     Other   Annual physical exam - Primary    Labs today.  Influenza vaccine given. Colonoscopy up-to-date.  Mammogram at the end of next month.      Hyperlipidemia   Relevant  Orders   Lipid panel   Other Visit Diagnoses     Obesity (BMI 30-39.9)       Relevant Orders   CMP14+EGFR   Screening for deficiency anemia       Relevant Orders   CBC   Need for vaccination       Relevant Orders   Flu Vaccine QUAD High Dose(Fluad) (Completed)       Meds ordered this encounter  Medications   Fluocinolone Acetonide 0.01 % OIL    Sig: Place 5 drops in ear(s) 2 (two) times daily as needed.    Dispense:  20 mL    Refill:  0    Follow-up: Annually  [place]

## 2022-04-10 NOTE — Assessment & Plan Note (Signed)
Labs today.  Influenza vaccine given. Colonoscopy up-to-date.  Mammogram at the end of next month.

## 2022-04-18 DIAGNOSIS — Z0120 Encounter for dental examination and cleaning without abnormal findings: Secondary | ICD-10-CM | POA: Diagnosis not present

## 2022-05-09 ENCOUNTER — Ambulatory Visit (HOSPITAL_COMMUNITY)
Admission: RE | Admit: 2022-05-09 | Discharge: 2022-05-09 | Disposition: A | Discharge status: Home / Self Care | Payer: Medicaid Other | Source: Ambulatory Visit | Attending: Family Medicine | Admitting: Family Medicine

## 2022-05-09 DIAGNOSIS — E785 Hyperlipidemia, unspecified: Secondary | ICD-10-CM | POA: Diagnosis not present

## 2022-05-09 DIAGNOSIS — Z1231 Encounter for screening mammogram for malignant neoplasm of breast: Secondary | ICD-10-CM | POA: Diagnosis not present

## 2022-05-09 DIAGNOSIS — E039 Hypothyroidism, unspecified: Secondary | ICD-10-CM | POA: Diagnosis not present

## 2022-05-09 DIAGNOSIS — Z130 Encounter for screening for diseases of the blood and blood-forming organs and certain disorders involving the immune mechanism: Secondary | ICD-10-CM | POA: Diagnosis not present

## 2022-05-09 DIAGNOSIS — E669 Obesity, unspecified: Secondary | ICD-10-CM | POA: Diagnosis not present

## 2022-05-10 LAB — CMP14+EGFR
ALT: 19 IU/L (ref 0–32)
AST: 19 IU/L (ref 0–40)
Albumin/Globulin Ratio: 2 (ref 1.2–2.2)
Albumin: 4.6 g/dL (ref 3.8–4.8)
Alkaline Phosphatase: 91 IU/L (ref 44–121)
BUN/Creatinine Ratio: 16 (ref 12–28)
BUN: 12 mg/dL (ref 8–27)
Bilirubin Total: 0.4 mg/dL (ref 0.0–1.2)
CO2: 24 mmol/L (ref 20–29)
Calcium: 9.6 mg/dL (ref 8.7–10.3)
Chloride: 104 mmol/L (ref 96–106)
Creatinine, Ser: 0.75 mg/dL (ref 0.57–1.00)
Globulin, Total: 2.3 g/dL (ref 1.5–4.5)
Glucose: 79 mg/dL (ref 70–99)
Potassium: 4.4 mmol/L (ref 3.5–5.2)
Sodium: 145 mmol/L — ABNORMAL HIGH (ref 134–144)
Total Protein: 6.9 g/dL (ref 6.0–8.5)
eGFR: 85 mL/min/{1.73_m2} (ref >59)

## 2022-05-10 LAB — LIPID PANEL
Chol/HDL Ratio: 2.2 ratio (ref 0.0–4.4)
Cholesterol, Total: 152 mg/dL (ref 100–199)
HDL: 68 mg/dL (ref >39)
LDL Chol Calc (NIH): 65 mg/dL (ref 0–99)
Triglycerides: 103 mg/dL (ref 0–149)
VLDL Cholesterol Cal: 19 mg/dL (ref 5–40)

## 2022-05-10 LAB — CBC
Hematocrit: 42.2 % (ref 34.0–46.6)
Hemoglobin: 13.9 g/dL (ref 11.1–15.9)
MCH: 30.3 pg (ref 26.6–33.0)
MCHC: 32.9 g/dL (ref 31.5–35.7)
MCV: 92 fL (ref 79–97)
Platelets: 243 10*3/uL (ref 150–450)
RBC: 4.58 x10E6/uL (ref 3.77–5.28)
RDW: 12.7 % (ref 11.7–15.4)
WBC: 4.7 10*3/uL (ref 3.4–10.8)

## 2022-05-10 LAB — TSH: TSH: 1.42 u[IU]/mL (ref 0.450–4.500)

## 2022-05-11 ENCOUNTER — Telehealth: Payer: Self-pay

## 2022-05-11 NOTE — Telephone Encounter (Signed)
Left message for a return call

## 2022-05-11 NOTE — Telephone Encounter (Signed)
Caller name: Mahira Petras  On DPR?: Yes  Call back number: [phone] (mobile)  Provider they see: Coral Spikes, DO  Reason for call:Pt daughter is calling wanting to get results of mammogram

## 2022-05-24 NOTE — Telephone Encounter (Signed)
Sent mychart message to patient

## 2022-05-24 NOTE — Telephone Encounter (Signed)
Mammo results are normal; just need provider verbal saying it is normal. Please advise. Thank you

## 2022-05-28 NOTE — Telephone Encounter (Signed)
Left detailed message on voicemail (OK per DPR) 

## 2022-07-04 ENCOUNTER — Ambulatory Visit: Payer: Medicaid Other | Admitting: Family Medicine

## 2022-07-04 VITALS — BP 113/60 | HR 99 | Temp 98.2°F | Ht 64.5 in | Wt 203.0 lb

## 2022-07-04 DIAGNOSIS — E039 Hypothyroidism, unspecified: Secondary | ICD-10-CM

## 2022-07-04 DIAGNOSIS — J988 Other specified respiratory disorders: Secondary | ICD-10-CM | POA: Diagnosis not present

## 2022-07-04 MED ORDER — AMOXICILLIN-POT CLAVULANATE 875-125 MG PO TABS: 1.0000 | ORAL_TABLET | Freq: Two times a day (BID) | ORAL | 0 refills | Status: DC

## 2022-07-04 MED ORDER — SYNTHROID 50 MCG PO TABS: 50.0000 ug | ORAL_TABLET | Freq: Every day | ORAL | 1 refills | Status: DC

## 2022-07-04 NOTE — Patient Instructions (Signed)
Medication as prescribed for your respiratory infection.  Take care  Dr. Lacinda Axon

## 2022-07-04 NOTE — Progress Notes (Signed)
Subjective:  Patient ID: Angel Russell, female    DOB: [birth year]  Age: 72 y.o. MRN: [id number]  CC: Chief Complaint  Patient presents with   Cough    Not much prod. X 7 days, slight fever, initial 2 days sore throat Taking mucinex    Nasal Congestion    Headache, using nasal spray    HPI:  72 year old female presents for evaluation of the above.  Patient has been sick for the past 7 days. Reports sore throat, cough, congestion, and headache. Subjective fever. Negative home COVID test. No relief with nasal spray.  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    Social Hx   Social History   Socioeconomic History   Marital status: Married    Spouse name: Not on file   Number of children: Not on file   Years of education: Not on file   Highest education level: Not on file  Occupational History   Not on file  Tobacco Use   Smoking status: Never   Smokeless tobacco: Never  Substance and Sexual Activity   Alcohol use: No    Alcohol/week: 0.0 standard drinks of alcohol    Comment: 1-2 times per month   Drug use: No   Sexual activity: Never  Other Topics Concern   Not on file  Social History Narrative   Not on file   Social Determinants of Health   Financial Resource Strain: Not on file  Food Insecurity: Not on file  Transportation Needs: Not on file  Physical Activity: Not on file  Stress: Not on file  Social Connections: Not on file    Review of Systems Per HPI  Objective:  BP 113/60   Pulse 99   Temp 98.2 F (36.8 C)   Ht 5' 4.5" (1.638 m)   Wt 203 lb (92.1 kg)   SpO2 96%   BMI 34.31 kg/m      07/04/2022   11:21 AM 04/09/2022    1:42 PM 04/03/2021   10:29 AM  BP/Weight  Systolic BP 123456 123456 92  Diastolic BP 60 74  60  Wt. (Lbs) 203 206.6 198.8  BMI 34.31 kg/m2 34.92 kg/m2 33.6 kg/m2    Physical Exam Constitutional:      General: She is not in acute distress. HENT:     Head: Normocephalic and atraumatic.     Mouth/Throat:     Pharynx: Oropharynx is clear.  Eyes:     General:        Right eye: No discharge.        Left eye: No discharge.     Conjunctiva/sclera: Conjunctivae normal.  Cardiovascular:     Rate and Rhythm: Normal rate and regular rhythm.  Pulmonary:     Effort: Pulmonary effort is normal.     Breath sounds: Normal breath sounds. No wheezing or rales.  Neurological:     Mental Status: She is alert.     Lab Results  Component Value Date   WBC 4.7 05/09/2022   HGB 13.9 05/09/2022   HCT 42.2 05/09/2022   PLT 243 05/09/2022   GLUCOSE 79 05/09/2022   CHOL 152 05/09/2022   TRIG 103 05/09/2022   HDL 68 05/09/2022   LDLCALC 65 05/09/2022   ALT 19 05/09/2022   AST 19  05/09/2022   NA 145 (H) 05/09/2022   K 4.4 05/09/2022   CL 104 05/09/2022   CREATININE 0.75 05/09/2022   BUN 12 05/09/2022   CO2 24 05/09/2022   TSH 1.420 05/09/2022     Assessment & Plan:   Problem List Items Addressed This Visit       Respiratory   Respiratory infection - Primary    Given persistent symptoms and lack of improvement, starting on Augmentin.        Endocrine   Hypothyroidism   Relevant Medications   SYNTHROID 50 MCG tablet    Meds ordered this encounter  Medications   amoxicillin-clavulanate (AUGMENTIN) 875-125 MG tablet    Sig: Take 1 tablet by mouth 2 (two) times daily.    Dispense:  14 tablet    Refill:  0   SYNTHROID 50 MCG tablet    Sig: Take 1 tablet (50 mcg total) by mouth daily.    Dispense:  90 tablet    Refill:  1    This prescription was filled on 01/08/2022. Any refills authorized will be placed on file.    Follow-up:  Return if symptoms worsen or fail to improve.  [place]

## 2022-07-04 NOTE — Assessment & Plan Note (Signed)
Given persistent symptoms and lack of improvement, starting on Augmentin.

## 2022-09-20 ENCOUNTER — Telehealth: Payer: Self-pay

## 2022-09-20 NOTE — Telephone Encounter (Signed)
Prescription Request  09/20/2022  LOV: Visit date not found  What is the name of the medication or equipment? Epi Pen Expired Need updated   Have you contacted your pharmacy to request a refill? Yes   Which pharmacy would you like this sent to?  [facility] - [place], [place] - [address] [place] [address] Phone: [phone] Fax: [phone]    Patient notified that their request is being sent to the clinical staff for review and that they should receive a response within 2 business days.   Please advise at Mobile [phone] (mobile)

## 2022-09-21 ENCOUNTER — Other Ambulatory Visit: Payer: Self-pay | Admitting: Family Medicine

## 2022-09-21 MED ORDER — EPINEPHRINE 0.3 MG/0.3ML IJ SOAJ: 0.3000 mg | INTRAMUSCULAR | 1 refills | Status: DC | PRN

## 2022-10-24 DIAGNOSIS — Z0120 Encounter for dental examination and cleaning without abnormal findings: Secondary | ICD-10-CM | POA: Diagnosis not present

## 2022-12-21 ENCOUNTER — Other Ambulatory Visit: Payer: Self-pay | Admitting: Family Medicine

## 2022-12-21 DIAGNOSIS — E039 Hypothyroidism, unspecified: Secondary | ICD-10-CM

## 2023-04-02 DIAGNOSIS — Z0120 Encounter for dental examination and cleaning without abnormal findings: Secondary | ICD-10-CM | POA: Diagnosis not present

## 2023-04-02 DIAGNOSIS — K029 Dental caries, unspecified: Secondary | ICD-10-CM | POA: Diagnosis not present

## 2023-04-08 ENCOUNTER — Other Ambulatory Visit (HOSPITAL_COMMUNITY): Payer: Self-pay | Admitting: Family Medicine

## 2023-04-08 DIAGNOSIS — Z1231 Encounter for screening mammogram for malignant neoplasm of breast: Secondary | ICD-10-CM

## 2023-05-02 DIAGNOSIS — H5213 Myopia, bilateral: Secondary | ICD-10-CM | POA: Diagnosis not present

## 2023-05-13 ENCOUNTER — Ambulatory Visit (HOSPITAL_COMMUNITY)
Admission: RE | Admit: 2023-05-13 | Discharge: 2023-05-13 | Disposition: A | Discharge status: Home / Self Care | Payer: Medicaid Other | Source: Ambulatory Visit | Attending: Family Medicine | Admitting: Family Medicine

## 2023-05-13 DIAGNOSIS — Z1231 Encounter for screening mammogram for malignant neoplasm of breast: Secondary | ICD-10-CM | POA: Insufficient documentation

## 2023-05-13 DIAGNOSIS — H524 Presbyopia: Secondary | ICD-10-CM | POA: Diagnosis not present

## 2023-05-16 ENCOUNTER — Encounter: Payer: Self-pay | Admitting: Nurse Practitioner

## 2023-05-16 ENCOUNTER — Ambulatory Visit: Payer: Medicaid Other | Admitting: Nurse Practitioner

## 2023-05-16 VITALS — BP 114/76 | HR 75 | Temp 97.7°F | Ht 64.5 in | Wt 207.8 lb

## 2023-05-16 DIAGNOSIS — N907 Vulvar cyst: Secondary | ICD-10-CM

## 2023-05-16 DIAGNOSIS — M150 Primary generalized (osteo)arthritis: Secondary | ICD-10-CM | POA: Diagnosis not present

## 2023-05-16 DIAGNOSIS — Z79899 Other long term (current) drug therapy: Secondary | ICD-10-CM | POA: Diagnosis not present

## 2023-05-16 DIAGNOSIS — E039 Hypothyroidism, unspecified: Secondary | ICD-10-CM

## 2023-05-16 DIAGNOSIS — Z01411 Encounter for gynecological examination (general) (routine) with abnormal findings: Secondary | ICD-10-CM

## 2023-05-16 DIAGNOSIS — E785 Hyperlipidemia, unspecified: Secondary | ICD-10-CM | POA: Diagnosis not present

## 2023-05-16 DIAGNOSIS — R21 Rash and other nonspecific skin eruption: Secondary | ICD-10-CM | POA: Diagnosis not present

## 2023-05-16 DIAGNOSIS — Z23 Encounter for immunization: Secondary | ICD-10-CM | POA: Diagnosis not present

## 2023-05-16 DIAGNOSIS — Z1283 Encounter for screening for malignant neoplasm of skin: Secondary | ICD-10-CM

## 2023-05-16 DIAGNOSIS — Z130 Encounter for screening for diseases of the blood and blood-forming organs and certain disorders involving the immune mechanism: Secondary | ICD-10-CM

## 2023-05-16 DIAGNOSIS — Z1211 Encounter for screening for malignant neoplasm of colon: Secondary | ICD-10-CM

## 2023-05-16 DIAGNOSIS — Z13228 Encounter for screening for other metabolic disorders: Secondary | ICD-10-CM

## 2023-05-16 DIAGNOSIS — Z01419 Encounter for gynecological examination (general) (routine) without abnormal findings: Secondary | ICD-10-CM

## 2023-05-16 MED ORDER — NAPROXEN 500 MG PO TABS: 500.0000 mg | ORAL_TABLET | Freq: Two times a day (BID) | ORAL | 2 refills | Status: DC | PRN

## 2023-05-16 MED ORDER — TRIAMCINOLONE ACETONIDE 0.1 % EX CREA: 1.0000 | TOPICAL_CREAM | Freq: Two times a day (BID) | CUTANEOUS | 0 refills | Status: DC

## 2023-05-16 MED ORDER — SYNTHROID 50 MCG PO TABS: 50.0000 ug | ORAL_TABLET | Freq: Every day | ORAL | 1 refills | Status: DC

## 2023-05-16 MED ORDER — ROSUVASTATIN CALCIUM 10 MG PO TABS: 10.0000 mg | ORAL_TABLET | Freq: Every day | ORAL | 0 refills | Status: DC

## 2023-05-16 NOTE — Progress Notes (Signed)
 Subjective:    Patient ID: Angel Russell, female    DOB: 12-14-1950, 73 y.o.   MRN: [id number]  HPI The patient comes in today for a wellness visit. Female Interpreter present via telehealth. Breast and pelvic exams were done without interpreter visualizing exams.    A review of their health history was completed.  A review of medications was also completed.  Any needed refills: all meds  Eating habits: Normal  Falls/  MVA accidents in past few months: No  Regular exercise: Yes, walking, housekeeping; taking care of pets  Specialist pt sees on regular basis: No  Preventative health issues were discussed.   Additional concerns:  Itching on the right labia for the past several days. Would like a cream. Married, same sexual partner.  No vaginal bleeding. Regular vision and dental exams.    Review of Systems  Constitutional:  Negative for activity change, appetite change and fatigue.  HENT:  Negative for sore throat and trouble swallowing.   Respiratory:  Negative for cough, chest tightness, shortness of breath and wheezing.   Cardiovascular:  Negative for chest pain.  Gastrointestinal:  Negative for abdominal distention, abdominal pain, constipation, diarrhea, nausea and vomiting.  Genitourinary:  Negative for difficulty urinating, dysuria, enuresis, frequency, genital sores, pelvic pain, urgency, vaginal bleeding and vaginal discharge.  Skin:  Positive for rash.         Objective:   Physical Exam Vitals and nursing note reviewed.  Constitutional:      General: She is not in acute distress.    Appearance: She is well-developed.  Neck:     Thyroid : No thyromegaly.     Trachea: No tracheal deviation.     Comments: Thyroid  non tender to palpation. No mass or goiter noted.  Cardiovascular:     Rate and Rhythm: Normal rate and regular rhythm.     Heart sounds: Normal heart sounds. No murmur heard. Pulmonary:     Effort: Pulmonary effort is normal.     Breath  sounds: Normal breath sounds.  Chest:  Breasts:    Right: No swelling, inverted nipple, mass, skin change or tenderness.     Left: No swelling, inverted nipple, mass, skin change or tenderness.  Abdominal:     General: There is no distension.     Palpations: Abdomen is soft.     Tenderness: There is no abdominal tenderness.  Genitourinary:    Exam position: Lithotomy position.     Labia:        Right: Rash present. No tenderness or lesion.        Left: No rash, tenderness or lesion.      Comments: Stable linear sebaceous cyst noted right labia. No erythema or tenderness.  Musculoskeletal:     Cervical back: Normal range of motion and neck supple.  Lymphadenopathy:     Cervical: No cervical adenopathy.     Upper Body:     Right upper body: No supraclavicular, axillary or pectoral adenopathy.     Left upper body: No supraclavicular, axillary or pectoral adenopathy.  Skin:    General: Skin is warm and dry.     Findings: Rash present.     Comments: Faint pink maculopapular rash with mild excoriation noted on the upper right labia.   Neurological:     Mental Status: She is alert and oriented to person, place, and time.  Psychiatric:        Mood and Affect: Mood normal.        Behavior:  Behavior normal.        Thought Content: Thought content normal.        Judgment: Judgment normal.    Today's Vitals   05/16/23 1026  BP: 114/76  Pulse: 75  Temp: 97.7 F (36.5 C)  SpO2: 99%  Weight: 207 lb 12.8 oz (94.3 kg)  Height: 5' 4.5 (1.638 m)   Body mass index is 35.12 kg/m.         Assessment & Plan:   Problem List Items Addressed This Visit       Endocrine   Hypothyroidism   Relevant Medications   SYNTHROID  50 MCG tablet   Other Relevant Orders   TSH     Musculoskeletal and Integument   Primary osteoarthritis involving multiple joints   Relevant Medications   naproxen  (NAPROSYN ) 500 MG tablet   Sebaceous cyst of labia   Stable cyst of the right labia majora.  Has been evaluated by GYN.         Other   Hyperlipidemia   Relevant Medications   rosuvastatin  (CRESTOR ) 10 MG tablet   Other Relevant Orders   Lipid panel   Other Visit Diagnoses       Well woman exam    -  Primary     Screen for colon cancer       Relevant Orders   Ambulatory referral to Gastroenterology     Screening for skin cancer       Relevant Orders   Ambulatory referral to Dermatology     Immunization due       Relevant Orders   Flu Vaccine Trivalent High Dose (Fluad) (Completed)   Pneumococcal conjugate vaccine 20-valent (Prevnar 20) (Completed)     High risk medication use       Relevant Orders   CBC with Differential/Platelet   Comprehensive metabolic panel     Rash and nonspecific skin eruption            Meds ordered this encounter  Medications   naproxen  (NAPROSYN ) 500 MG tablet    Sig: Take 1 tablet (500 mg total) by mouth 2 (two) times daily as needed for moderate pain (pain score 4-6). Prn pain    Dispense:  30 tablet    Refill:  2    Supervising Provider:   ALPHONSA HAMILTON A [[id number]]   rosuvastatin  (CRESTOR ) 10 MG tablet    Sig: Take 1 tablet (10 mg total) by mouth daily.    Dispense:  90 tablet    Refill:  0    Supervising Provider:   ALPHONSA HAMILTON A [[id number]]   SYNTHROID  50 MCG tablet    Sig: Take 1 tablet (50 mcg total) by mouth daily.    Dispense:  90 tablet    Refill:  1    This prescription was filled on 10/02/2022. Any refills authorized will be placed on file.    Supervising Provider:   ALPHONSA HAMILTON A [[id number]]   triamcinolone  cream (KENALOG ) 0.1 %    Sig: Apply 1 Application topically 2 (two) times daily. Prn rash; use up to 2 weeks    Dispense:  30 g    Refill:  0    Supervising Provider:   ALPHONSA HAMILTON A [[id number]]   Triamcinolone  to rash.  Call back if persist.  Defers further intervention for the sebaceous cyst. Refills given on her medications. Given phone number for radiology so patient can schedule a bone density test.  Will need to  do this based on her  daughter's schedule. Advised patient that colonoscopy is due.  Will try to do that this summer when her daughter is off work. Prevnar 20 and flu vaccine given today. Lab work pending. Also recommend that patient consider a recheck with dermatology for skin cancer screening. Return in about 1 year (around 05/15/2024) for physical.

## 2023-05-17 ENCOUNTER — Encounter: Payer: Self-pay | Admitting: Nurse Practitioner

## 2023-05-17 DIAGNOSIS — N907 Vulvar cyst: Secondary | ICD-10-CM | POA: Insufficient documentation

## 2023-05-17 NOTE — Assessment & Plan Note (Signed)
 Stable cyst of the right labia majora. Has been evaluated by GYN.

## 2023-05-21 DIAGNOSIS — Z130 Encounter for screening for diseases of the blood and blood-forming organs and certain disorders involving the immune mechanism: Secondary | ICD-10-CM | POA: Diagnosis not present

## 2023-05-21 DIAGNOSIS — E039 Hypothyroidism, unspecified: Secondary | ICD-10-CM | POA: Diagnosis not present

## 2023-05-21 DIAGNOSIS — E785 Hyperlipidemia, unspecified: Secondary | ICD-10-CM | POA: Diagnosis not present

## 2023-05-21 DIAGNOSIS — Z13228 Encounter for screening for other metabolic disorders: Secondary | ICD-10-CM | POA: Diagnosis not present

## 2023-05-22 LAB — LIPID PANEL
Chol/HDL Ratio: 3.6 {ratio} (ref 0.0–4.4)
Cholesterol, Total: 250 mg/dL — ABNORMAL HIGH (ref 100–199)
HDL: 69 mg/dL (ref >39)
LDL Chol Calc (NIH): 159 mg/dL — ABNORMAL HIGH (ref 0–99)
Triglycerides: 127 mg/dL (ref 0–149)
VLDL Cholesterol Cal: 22 mg/dL (ref 5–40)

## 2023-05-22 LAB — CBC WITH DIFFERENTIAL/PLATELET
Basophils Absolute: 0 10*3/uL (ref 0.0–0.2)
Basos: 1 %
EOS (ABSOLUTE): 0.3 10*3/uL (ref 0.0–0.4)
Eos: 8 %
Hematocrit: 43 % (ref 34.0–46.6)
Hemoglobin: 13.8 g/dL (ref 11.1–15.9)
Immature Grans (Abs): 0 10*3/uL (ref 0.0–0.1)
Immature Granulocytes: 0 %
Lymphocytes Absolute: 1.1 10*3/uL (ref 0.7–3.1)
Lymphs: 28 %
MCH: 29.6 pg (ref 26.6–33.0)
MCHC: 32.1 g/dL (ref 31.5–35.7)
MCV: 92 fL (ref 79–97)
Monocytes Absolute: 0.3 10*3/uL (ref 0.1–0.9)
Monocytes: 9 %
Neutrophils Absolute: 2.1 10*3/uL (ref 1.4–7.0)
Neutrophils: 54 %
Platelets: 248 10*3/uL (ref 150–450)
RBC: 4.66 x10E6/uL (ref 3.77–5.28)
RDW: 12.6 % (ref 11.7–15.4)
WBC: 3.9 10*3/uL (ref 3.4–10.8)

## 2023-05-22 LAB — COMPREHENSIVE METABOLIC PANEL
ALT: 15 [IU]/L (ref 0–32)
AST: 15 [IU]/L (ref 0–40)
Albumin: 4.2 g/dL (ref 3.8–4.8)
Alkaline Phosphatase: 108 [IU]/L (ref 44–121)
BUN/Creatinine Ratio: 18 (ref 12–28)
BUN: 15 mg/dL (ref 8–27)
Bilirubin Total: 0.4 mg/dL (ref 0.0–1.2)
CO2: 26 mmol/L (ref 20–29)
Calcium: 9.4 mg/dL (ref 8.7–10.3)
Chloride: 105 mmol/L (ref 96–106)
Creatinine, Ser: 0.83 mg/dL (ref 0.57–1.00)
Globulin, Total: 2.6 g/dL (ref 1.5–4.5)
Glucose: 84 mg/dL (ref 70–99)
Potassium: 4.3 mmol/L (ref 3.5–5.2)
Sodium: 140 mmol/L (ref 134–144)
Total Protein: 6.8 g/dL (ref 6.0–8.5)
eGFR: 74 mL/min/{1.73_m2} (ref >59)

## 2023-05-22 LAB — TSH: TSH: 3.46 u[IU]/mL (ref 0.450–4.500)

## 2023-05-30 ENCOUNTER — Telehealth: Payer: Self-pay

## 2023-05-30 NOTE — Telephone Encounter (Signed)
----   pt daughter has been informed of her lab results and recommendations--- she will ask about cholesterol meds daily compliance  Copied from CRM [id number]. Topic: Clinical - Lab/Test Results >> May 30, 2023 10:42 AM Elle L wrote: Reason for CRM: The patient's daughter was returning a nurses call regarding her mammogram results. I attempted to call CAL. Norm Salt the patient's daughter's call back number is [phone].

## 2023-05-30 NOTE — Telephone Encounter (Signed)
Called and left a message for pt to return call for details of lab and mammogram results.

## 2023-06-05 ENCOUNTER — Telehealth: Payer: Self-pay | Admitting: *Deleted

## 2023-06-05 NOTE — Telephone Encounter (Signed)
Copied from CRM [id number]. Topic: Referral - Status >> Jun 05, 2023  2:14 PM Monisha R wrote: Reason for CRM: Patient wants to know the status for Dermatology, Gastrology and about Bone Density as well. Please call to assist with how to move forward. I did advise patient that the status shows authorized.

## 2023-06-06 ENCOUNTER — Encounter: Payer: Self-pay | Admitting: *Deleted

## 2023-06-06 IMAGING — MG MM DIGITAL SCREENING BILAT W/ TOMO AND CAD
6 of 10 series · 6 of 30 positions shown · non-contrast
Comparison: Previous exam(s).

ACR Breast Density Category a: The breast tissue is almost entirely
fatty.

CLINICAL DATA: Screening.

EXAM:
DIGITAL SCREENING BILATERAL MAMMOGRAM WITH TOMOSYNTHESIS AND CAD
TECHNIQUE: Bilateral screening digital craniocaudal and mediolateral oblique
mammograms were obtained. Bilateral screening digital breast
tomosynthesis was performed. The images were evaluated with
computer-aided detection.

[R CC synth-2D]
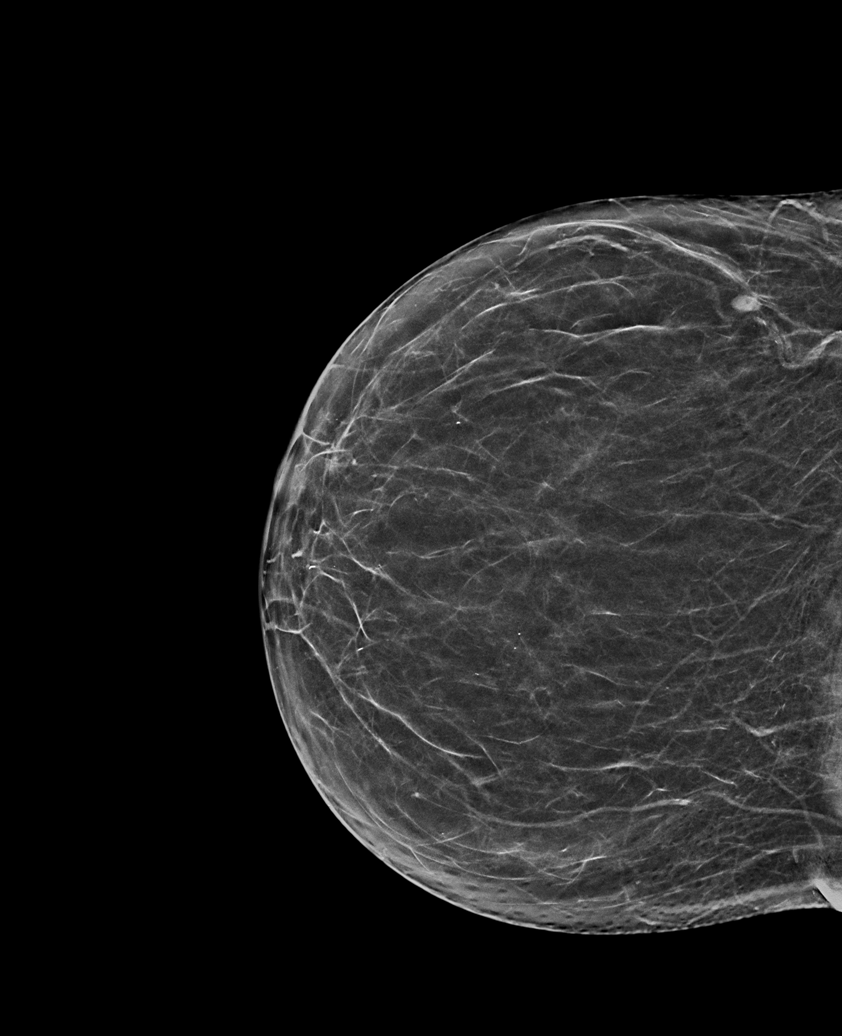

[R MLO synth-2D (1 of 2)]
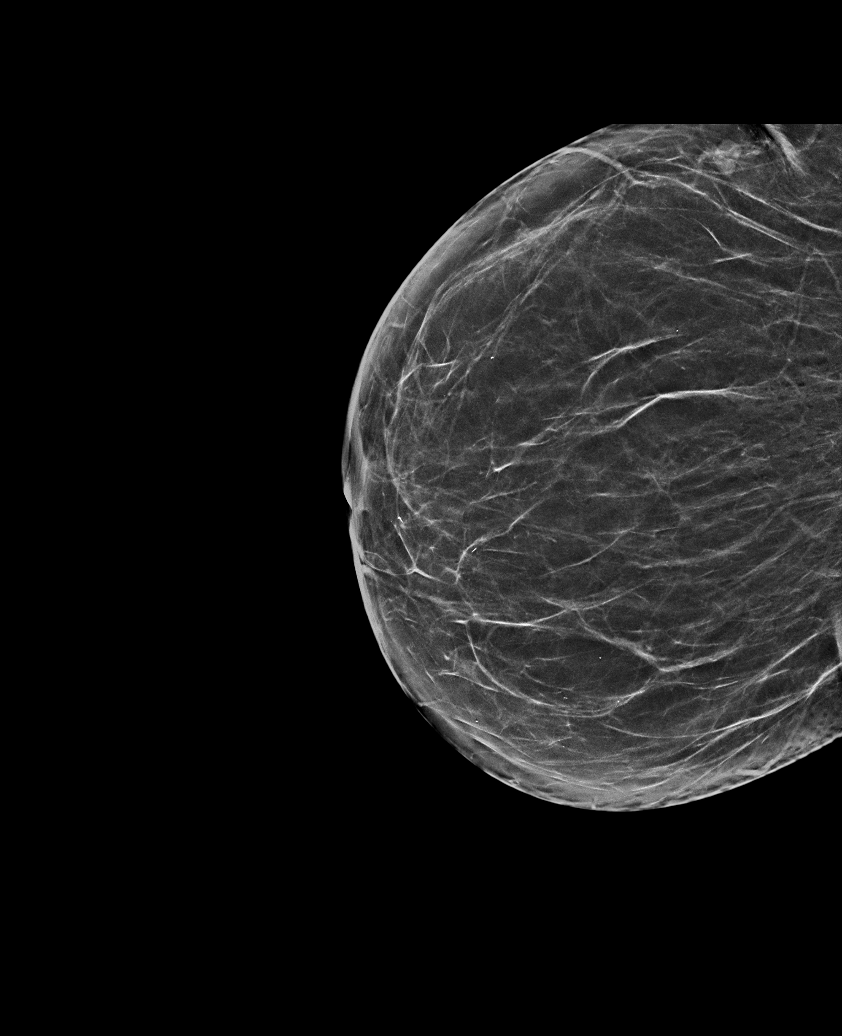

[R MLO synth-2D (2 of 2)]
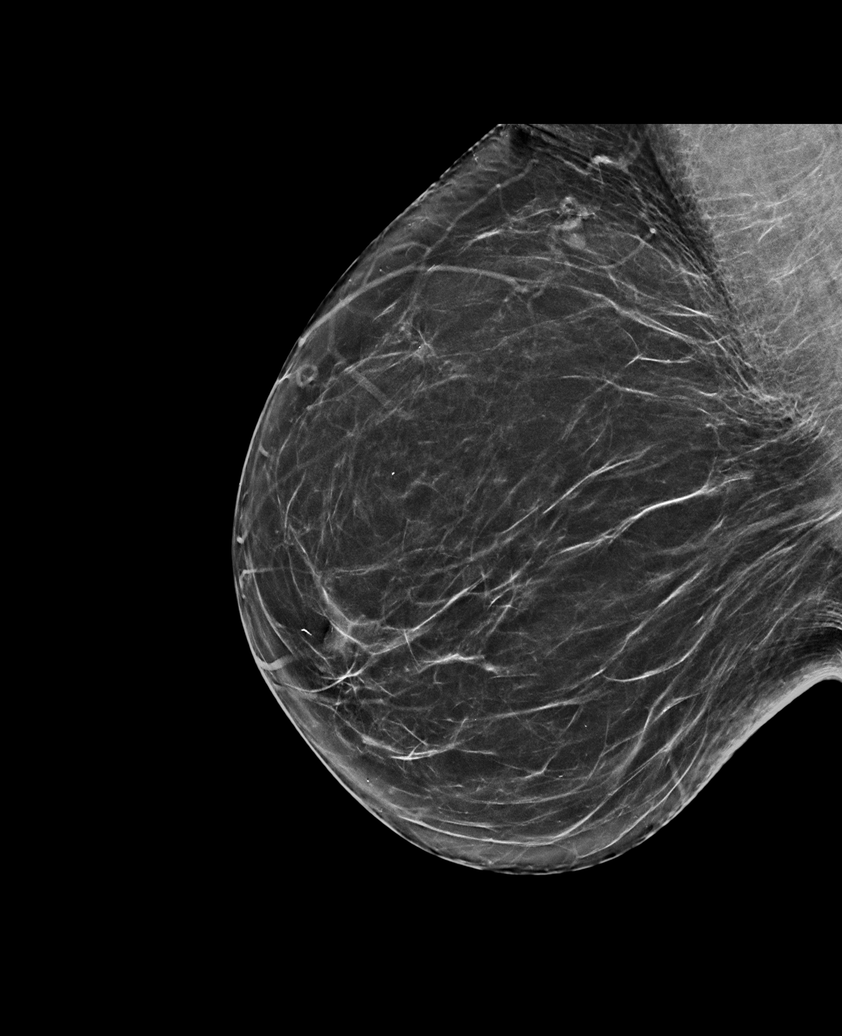

[L CC synth-2D]
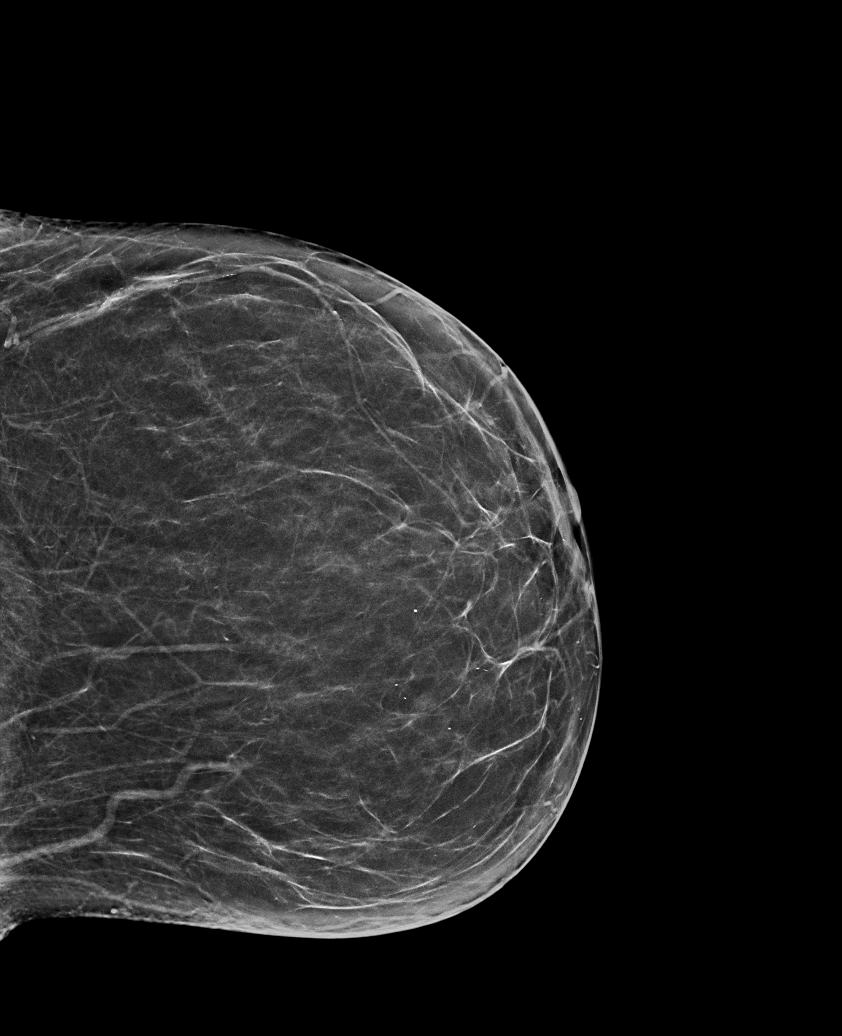

[L MLO synth-2D]
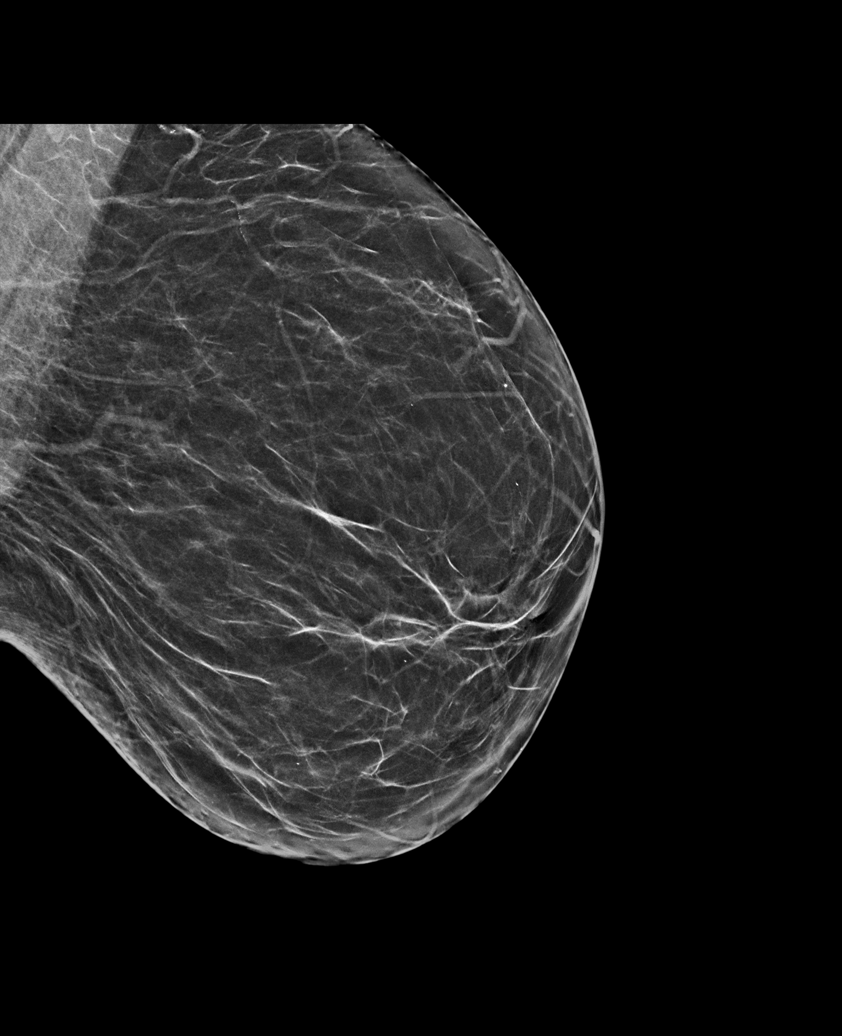

[L CC tomo · tomo slice 31/62.0]
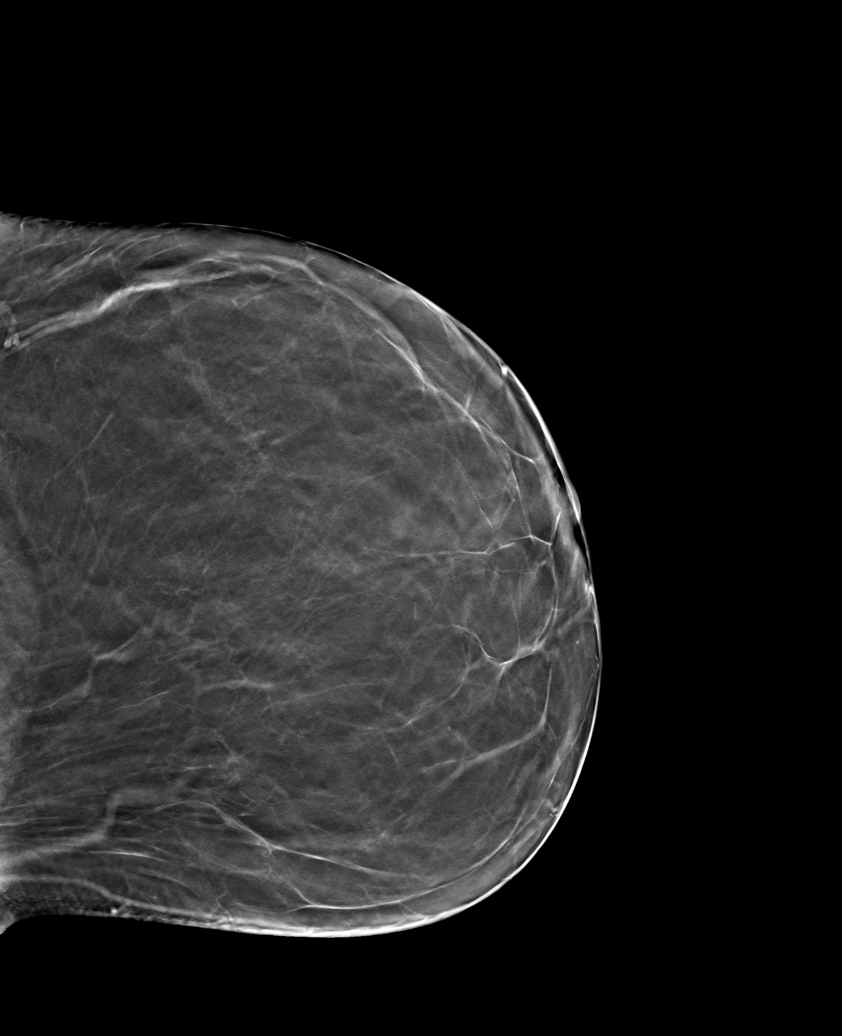

[6 of 30 positions shown; findings below may reference images not displayed]

FINDINGS: There are no findings suspicious for malignancy.
IMPRESSION: No mammographic evidence of malignancy. A result letter of this
screening mammogram will be mailed directly to the patient.

RECOMMENDATION:
Screening mammogram in one year. (Code:0E-3-N98)

BI-RADS CATEGORY  1: Negative.

## 2023-06-07 NOTE — Telephone Encounter (Signed)
Campbell Riches, NP     I don't think she knows how to access my chart and does not speak [place]. Her daughter helps with interpretation but says her mother cannot access the account. Please have them contact Leonie Man her daughter. She is our patient and in Epic. Thanks.

## 2023-06-11 ENCOUNTER — Other Ambulatory Visit: Payer: Self-pay

## 2023-06-11 DIAGNOSIS — Z780 Asymptomatic menopausal state: Secondary | ICD-10-CM

## 2023-06-11 DIAGNOSIS — Z13820 Encounter for screening for osteoporosis: Secondary | ICD-10-CM

## 2023-06-11 NOTE — Telephone Encounter (Signed)
Left a message for pt to return call for the details of her referrals. GI has called and mailed a letter trying to contact her as well

## 2023-06-12 NOTE — Telephone Encounter (Signed)
Spoke with the daughter regarding referral information.

## 2023-06-13 ENCOUNTER — Telehealth: Payer: Self-pay

## 2023-06-13 NOTE — Telephone Encounter (Signed)
Pt requesting call back to schedule colonoscopy.

## 2023-06-13 NOTE — Telephone Encounter (Signed)
okay

## 2023-06-26 ENCOUNTER — Other Ambulatory Visit (HOSPITAL_COMMUNITY): Payer: Medicaid Other

## 2023-07-01 ENCOUNTER — Telehealth: Payer: Self-pay

## 2023-07-01 NOTE — Telephone Encounter (Signed)
 Pt requesting call back to schedule colonoscopy.

## 2023-07-01 NOTE — Telephone Encounter (Signed)
Communication  Reason for CRM: patient needs to be on levothyroxine (SYNTHROID) 50 MCG tablet when she was on different medication she had an allergic reaction

## 2023-07-02 ENCOUNTER — Telehealth: Payer: Self-pay

## 2023-07-02 ENCOUNTER — Telehealth: Payer: Self-pay | Admitting: *Deleted

## 2023-07-02 DIAGNOSIS — E039 Hypothyroidism, unspecified: Secondary | ICD-10-CM

## 2023-07-02 MED ORDER — SYNTHROID 50 MCG PO TABS: 50.0000 ug | ORAL_TABLET | Freq: Every day | ORAL | 1 refills | Status: DC

## 2023-07-02 NOTE — Telephone Encounter (Signed)
Copied from CRM [id number]. Topic: Clinical - Prescription Issue >> Jul 01, 2023  4:10 PM Geroge Baseman wrote: Reason for CRM: Patients daughter called to state the issue with her medication has been resolved

## 2023-07-02 NOTE — Telephone Encounter (Signed)
Returned phone call to patient to schedule her colonoscopy.  LVM with the assistance of [employer] ID [id number].  Last colonoscopy performed by Dr. Tobi Bastos 05/05/2018.  Thanks, [place], [place]

## 2023-07-02 NOTE — Telephone Encounter (Signed)
Copied from CRM [id number]. Topic: Clinical - Prescription Issue >> Jul 01, 2023  4:10 PM Geroge Baseman wrote: Reason for CRM: Patients daughter called to state the issue with her medication has been resolved >> Jul 01, 2023  4:23 PM CMA Herbert Seta H wrote: Please send to correct office

## 2023-07-04 ENCOUNTER — Other Ambulatory Visit: Payer: Self-pay

## 2023-07-04 ENCOUNTER — Telehealth: Payer: Self-pay

## 2023-07-04 DIAGNOSIS — Z860100 Personal history of colon polyps, unspecified: Secondary | ICD-10-CM

## 2023-07-04 MED ORDER — NA SULFATE-K SULFATE-MG SULF 17.5-3.13-1.6 GM/177ML PO SOLN: 1.0000 | Freq: Once | ORAL | 0 refills | Status: AC

## 2023-07-04 NOTE — Telephone Encounter (Signed)
Gastroenterology Pre-Procedure Review  Request Date: 08/15/23 Requesting Physician: Dr. Tobi Bastos Pt gave permission to speak with her daughter Carloyn Jaeger [phone]  PATIENT REVIEW QUESTIONS: The patient responded to the following health history questions as indicated:    1. Are you having any GI issues?  no 2. Do you have a personal history of Polyps? yes (last colonoscopy performed by Dr. Tobi Bastos 05/05/18 recommended repeat in 3 years ) 3. Do you have a family history of Colon Cancer or Polyps? no 4. Diabetes Mellitus? no 5. Joint replacements in the past 12 months?no 6. Major health problems in the past 3 months?no 7. Any artificial heart valves, MVP, or defibrillator?no    MEDICATIONS & ALLERGIES:    Patient reports the following regarding taking any anticoagulation/antiplatelet therapy:   Plavix, Coumadin, Eliquis, Xarelto, Lovenox, Pradaxa, Brilinta, or Effient? no Aspirin? no  Patient confirms/reports the following medications:  Current Outpatient Medications  Medication Sig Dispense Refill   EPINEPHrine (EPIPEN 2-PAK) 0.3 mg/0.3 mL IJ SOAJ injection Inject 0.3 mg into the muscle as needed for anaphylaxis. 1 each 1   naproxen (NAPROSYN) 500 MG tablet Take 1 tablet (500 mg total) by mouth 2 (two) times daily as needed for moderate pain (pain score 4-6). Prn pain 30 tablet 2   rosuvastatin (CRESTOR) 10 MG tablet Take 1 tablet (10 mg total) by mouth daily. 90 tablet 0   SYNTHROID 50 MCG tablet Take 1 tablet (50 mcg total) by mouth daily. 90 tablet 1   triamcinolone cream (KENALOG) 0.1 % Apply 1 Application topically 2 (two) times daily. Prn rash; use up to 2 weeks 30 g 0   No current facility-administered medications for this visit.    Patient confirms/reports the following allergies:  Allergies  Allergen Reactions   Iodine Rash    No orders of the defined types were placed in this encounter.   AUTHORIZATION INFORMATION Primary Insurance: 1D#: Group #:  Secondary  Insurance: 1D#: Group #:  SCHEDULE INFORMATION: Date: 08/15/23 Time: Location: [facility]

## 2023-07-08 ENCOUNTER — Ambulatory Visit (HOSPITAL_COMMUNITY)
Admission: RE | Admit: 2023-07-08 | Discharge: 2023-07-08 | Disposition: A | Discharge status: Home / Self Care | Payer: Medicaid Other | Source: Ambulatory Visit | Attending: Nurse Practitioner | Admitting: Nurse Practitioner

## 2023-07-08 DIAGNOSIS — Z780 Asymptomatic menopausal state: Secondary | ICD-10-CM | POA: Insufficient documentation

## 2023-07-08 DIAGNOSIS — Z13820 Encounter for screening for osteoporosis: Secondary | ICD-10-CM | POA: Diagnosis not present

## 2023-07-08 DIAGNOSIS — M8588 Other specified disorders of bone density and structure, other site: Secondary | ICD-10-CM | POA: Diagnosis not present

## 2023-07-10 ENCOUNTER — Encounter: Payer: Self-pay | Admitting: Nurse Practitioner

## 2023-07-10 DIAGNOSIS — M8580 Other specified disorders of bone density and structure, unspecified site: Secondary | ICD-10-CM | POA: Insufficient documentation

## 2023-07-17 NOTE — Progress Notes (Signed)
 Mychart message sent.

## 2023-08-13 NOTE — Telephone Encounter (Signed)
 The patient called in about instructions transfer call to [facility].

## 2023-08-15 ENCOUNTER — Ambulatory Visit: Admitting: Anesthesiology

## 2023-08-15 ENCOUNTER — Other Ambulatory Visit: Payer: Self-pay

## 2023-08-15 ENCOUNTER — Encounter: Payer: Self-pay | Admitting: Gastroenterology

## 2023-08-15 ENCOUNTER — Encounter
Admission: RE | Disposition: A | Discharge status: Home / Self Care | Payer: Self-pay | Source: Home / Self Care | Attending: Gastroenterology

## 2023-08-15 ENCOUNTER — Ambulatory Visit
Admission: RE | Admit: 2023-08-15 | Discharge: 2023-08-15 | Disposition: A | Discharge status: Home / Self Care | Payer: Medicaid Other | Attending: Gastroenterology | Admitting: Gastroenterology

## 2023-08-15 DIAGNOSIS — Z1211 Encounter for screening for malignant neoplasm of colon: Secondary | ICD-10-CM | POA: Insufficient documentation

## 2023-08-15 DIAGNOSIS — Z860101 Personal history of adenomatous and serrated colon polyps: Secondary | ICD-10-CM | POA: Insufficient documentation

## 2023-08-15 DIAGNOSIS — K640 First degree hemorrhoids: Secondary | ICD-10-CM | POA: Diagnosis not present

## 2023-08-15 DIAGNOSIS — Z860100 Personal history of colon polyps, unspecified: Secondary | ICD-10-CM | POA: Diagnosis not present

## 2023-08-15 DIAGNOSIS — E039 Hypothyroidism, unspecified: Secondary | ICD-10-CM | POA: Diagnosis not present

## 2023-08-15 HISTORY — DX: Hypothyroidism, unspecified: E03.9

## 2023-08-15 HISTORY — PX: COLONOSCOPY WITH PROPOFOL: SHX5780

## 2023-08-15 SURGERY — COLONOSCOPY WITH PROPOFOL
Anesthesia: General

## 2023-08-15 MED ORDER — PROPOFOL 10 MG/ML IV BOLUS
INTRAVENOUS | Status: DC | PRN
  Administered 2023-08-15: 100 mg via INTRAVENOUS

## 2023-08-15 MED ORDER — PROPOFOL 1000 MG/100ML IV EMUL
INTRAVENOUS | Status: AC
  Filled 2023-08-15: qty 100

## 2023-08-15 MED ORDER — SODIUM CHLORIDE 0.9 % IV SOLN: INTRAVENOUS | Status: DC

## 2023-08-15 MED ORDER — PROPOFOL 500 MG/50ML IV EMUL
INTRAVENOUS | Status: DC | PRN
  Administered 2023-08-15: 150 ug/kg/min via INTRAVENOUS

## 2023-08-15 NOTE — Transfer of Care (Signed)
 Immediate Anesthesia Transfer of Care Note  Patient: Angel Russell  Procedure(s) Performed: COLONOSCOPY WITH PROPOFOL  Patient Location: PACU  Anesthesia Type:General  Level of Consciousness: awake and sedated  Airway & Oxygen Therapy: Patient Spontanous Breathing and Patient connected to nasal cannula oxygen  Post-op Assessment: Report given to RN and Post -op Vital signs reviewed and stable  Post vital signs: Reviewed and stable  Last Vitals:  Vitals Value Taken Time  BP    Temp    Pulse    Resp    SpO2      Last Pain:  Vitals:   08/15/23 0937  TempSrc: Temporal  PainSc: 0-No pain         Complications: There were no known notable events for this encounter.

## 2023-08-15 NOTE — H&P (Signed)
 Wyline Mood, MD [address], [address], [place], [place], [address] [address], [place], [place], [address] Phone: [phone]  Fax: [phone]  Primary Care Physician:  Tommie Sams, DO   Pre-Procedure History & Physical: HPI:  Ellouise Mcwhirter is a 73 y.o. female is here for an colonoscopy.   Past Medical History:  Diagnosis Date   Arthritis    Family history of bladder problems    H pylori ulcer    Labial cyst    Ovarian cancer (HCC)    Ulcer of small intestine     Past Surgical History:  Procedure Laterality Date   APPENDECTOMY     COLONOSCOPY WITH PROPOFOL N/A 05/05/2018   Procedure: COLONOSCOPY WITH PROPOFOL;  Surgeon: Wyline Mood, MD;  [facility] ENDOSCOPY;  Service: Gastroenterology;  Laterality: N/A;   FOOT SURGERY     TOTAL ABDOMINAL HYSTERECTOMY W/ BILATERAL SALPINGOOPHORECTOMY      Prior to Admission medications   Medication Sig Start Date End Date Taking? Authorizing Provider  EPINEPHrine (EPIPEN 2-PAK) 0.3 mg/0.3 mL IJ SOAJ injection Inject 0.3 mg into the muscle as needed for anaphylaxis. 09/21/22  Yes Cook, Jayce G, DO  naproxen (NAPROSYN) 500 MG tablet Take 1 tablet (500 mg total) by mouth 2 (two) times daily as needed for moderate pain (pain score 4-6). Prn pain 05/16/23  Yes Sherie Don C, NP  rosuvastatin (CRESTOR) 10 MG tablet Take 1 tablet (10 mg total) by mouth daily. 05/16/23  Yes Campbell Riches, NP  SYNTHROID 50 MCG tablet Take 1 tablet (50 mcg total) by mouth daily. 07/02/23  Yes Cook, Jayce G, DO  triamcinolone cream (KENALOG) 0.1 % Apply 1 Application topically 2 (two) times daily. Prn rash; use up to 2 weeks 05/16/23  Yes Campbell Riches, NP    Allergies as of 07/05/2023 - Review Complete 07/04/2023  Allergen Reaction Noted   Iodine Rash 12/14/2015    Family History  Problem Relation Age of Onset   Varicose Veins Father    Thyroid disease Son    Luiz Blare' disease Son     Social History   Socioeconomic History    Marital status: Married    Spouse name: Not on file   Number of children: Not on file   Years of education: Not on file   Highest education level: Not on file  Occupational History   Not on file  Tobacco Use   Smoking status: Never   Smokeless tobacco: Never  Vaping Use   Vaping status: Never Used  Substance and Sexual Activity   Alcohol use: No    Alcohol/week: 0.0 standard drinks of alcohol    Comment: 1-2 times per month   Drug use: No   Sexual activity: Never  Other Topics Concern   Not on file  Social History Narrative   Not on file   Social Drivers of Health   Financial Resource Strain: Not on file  Food Insecurity: Not on file  Transportation Needs: Not on file  Physical Activity: Not on file  Stress: Not on file  Social Connections: Not on file  Intimate Partner Violence: Not on file    Review of Systems: See HPI, otherwise negative ROS  Physical Exam: There were no vitals taken for this visit. General:   Alert,  pleasant and cooperative in NAD Head:  Normocephalic and atraumatic. Neck:  Supple; no masses or thyromegaly. Lungs:  Clear throughout to auscultation, normal respiratory effort.    Heart:  +S1, +S2, Regular  rate and rhythm, No edema. Abdomen:  Soft, nontender and nondistended. Normal bowel sounds, without guarding, and without rebound.   Neurologic:  Alert and  oriented x4;  grossly normal neurologically.  Impression/Plan: Isma Tietje is here for an colonoscopy to be performed for surveillance due to prior history of colon polyps   Risks, benefits, limitations, and alternatives regarding  colonoscopy have been reviewed with the patient via video interpretor IPAD.  Questions have been answered.  All parties agreeable.   Wyline Mood, MD  08/15/2023, 9:30 AM

## 2023-08-15 NOTE — Anesthesia Postprocedure Evaluation (Signed)
 Anesthesia Post Note  Patient: Angel Russell  Procedure(s) Performed: COLONOSCOPY WITH PROPOFOL  Patient location during evaluation: Endoscopy Anesthesia Type: General Level of consciousness: awake and alert Pain management: pain level controlled Vital Signs Assessment: post-procedure vital signs reviewed and stable Respiratory status: spontaneous breathing, nonlabored ventilation, respiratory function stable and patient connected to nasal cannula oxygen Cardiovascular status: blood pressure returned to baseline and stable Postop Assessment: no apparent nausea or vomiting Anesthetic complications: no   There were no known notable events for this encounter.   Last Vitals:  Vitals:   08/15/23 1029 08/15/23 1039  BP: 97/63 106/69  Pulse: 68 70  Resp: 18 18  Temp:    SpO2: 100% 100%    Last Pain:  Vitals:   08/15/23 1039  TempSrc:   PainSc: 0-No pain                 [name]

## 2023-08-15 NOTE — Anesthesia Preprocedure Evaluation (Signed)
 Anesthesia Evaluation  Patient identified by MRN, date of birth, ID band Patient awake    Reviewed: Allergy & Precautions, NPO status , Patient's Chart, lab work & pertinent test results  History of Anesthesia Complications Negative for: history of anesthetic complications  Airway Mallampati: III  TM Distance: >3 FB Neck ROM: full    Dental no notable dental hx.    Pulmonary neg pulmonary ROS   Pulmonary exam normal        Cardiovascular negative cardio ROS Normal cardiovascular exam     Neuro/Psych negative neurological ROS  negative psych ROS   GI/Hepatic Neg liver ROS, PUD,,,  Endo/Other  negative endocrine ROSHypothyroidism    Renal/GU negative Renal ROS  negative genitourinary   Musculoskeletal  (+) Arthritis ,    Abdominal   Peds  Hematology negative hematology ROS (+)   Anesthesia Other Findings Past Medical History: No date: Arthritis No date: Family history of bladder problems No date: H pylori ulcer No date: Hypothyroidism No date: Labial cyst No date: Ovarian cancer (HCC) No date: Ulcer of small intestine  Past Surgical History: No date: APPENDECTOMY No date: COLON SURGERY 05/05/2018: COLONOSCOPY WITH PROPOFOL; N/A     Comment:  Procedure: COLONOSCOPY WITH PROPOFOL;  Surgeon: Wyline Mood, MD;  Location: [facility] ENDOSCOPY;  Service:               Gastroenterology;  Laterality: N/A; No date: FOOT SURGERY No date: TOTAL ABDOMINAL HYSTERECTOMY W/ BILATERAL SALPINGOOPHORECTOMY     Reproductive/Obstetrics negative OB ROS                             Anesthesia Physical Anesthesia Plan  ASA: 3  Anesthesia Plan: General   Post-op Pain Management: Minimal or no pain anticipated   Induction: Intravenous  PONV Risk Score and Plan: 2 and Propofol infusion and TIVA  Airway Management Planned: Natural Airway and Nasal Cannula  Additional Equipment:    Intra-op Plan:   Post-operative Plan:   Informed Consent: I have reviewed the patients History and Physical, chart, labs and discussed the procedure including the risks, benefits and alternatives for the proposed anesthesia with the patient or authorized representative who has indicated his/her understanding and acceptance.     Dental Advisory Given  Plan Discussed with: Anesthesiologist, CRNA and Surgeon  Anesthesia Plan Comments: (Patient consented for risks of anesthesia including but not limited to:  - adverse reactions to medications - risk of airway placement if required - damage to eyes, teeth, lips or other oral mucosa - nerve damage due to positioning  - sore throat or hoarseness - Damage to heart, brain, nerves, lungs, other parts of body or loss of life  Patient voiced understanding and assent.)       Anesthesia Quick Evaluation

## 2023-08-15 NOTE — Op Note (Signed)
 [facility] Gastroenterology Patient Name: Angel Russell Procedure Date: 08/15/2023 9:50 AM MRN: [id number] Account #: [id number] Date of Birth: Apr 15, 1951 Admit Type: Outpatient Age: 73 Room: [facility] ENDO ROOM 3 Gender: Female Note Status: Finalized Instrument Name: Nelda Marseille [id number] Procedure:             Colonoscopy Indications:           Surveillance: Personal history of adenomatous polyps                         on last colonoscopy > 5 years ago, Last colonoscopy:                         December 2019 Providers:             Wyline Mood MD, MD Referring MD:          Verdis Frederickson. Adriana Simas MD, MD (Referring MD) Medicines:             Monitored Anesthesia Care Complications:         No immediate complications. Procedure:             Pre-Anesthesia Assessment:                        - Prior to the procedure, a History and Physical was                         performed, and patient medications, allergies and                         sensitivities were reviewed. The patient's tolerance                         of previous anesthesia was reviewed.                        - The risks and benefits of the procedure and the                         sedation options and risks were discussed with the                         patient. All questions were answered and informed                         consent was obtained.                        - ASA Grade Assessment: II - A patient with mild                         systemic disease.                        After obtaining informed consent, the colonoscope was                         passed under direct vision. Throughout the procedure,                         the  patient's blood pressure, pulse, and oxygen                         saturations were monitored continuously. The                         Colonoscope was introduced through the anus and                         advanced to the the cecum, identified by the                          appendiceal orifice. The colonoscopy was performed                         with ease. The patient tolerated the procedure well.                         The quality of the bowel preparation was excellent.                         The ileocecal valve, appendiceal orifice, and rectum                         were photographed. Findings:      The perianal and digital rectal examinations were normal.      The entire examined colon appeared normal on direct and retroflexion       views.      Non-bleeding internal hemorrhoids were found during retroflexion. The       hemorrhoids were medium-sized and Grade I (internal hemorrhoids that do       not prolapse). Impression:            - The entire examined colon is normal on direct and                         retroflexion views.                        - No specimens collected. Recommendation:        - Discharge patient to home (with escort).                        - Resume previous diet.                        - Continue present medications.                        - Repeat colonoscopy is not recommended due to current                         age (21 years or older) for surveillance. Procedure Code(s):     --- Professional ---                        [phone], Colonoscopy, flexible; diagnostic, including                         collection of specimen(s) by brushing or washing, when  performed (separate procedure) Diagnosis Code(s):     --- Professional ---                        Z86.010, Personal history of colonic polyps CPT copyright 2022 American Medical Association. All rights reserved. The codes documented in this report are preliminary and upon coder review may  be revised to meet current compliance requirements. Wyline Mood, MD Wyline Mood MD, MD 08/15/2023 10:16:42 AM This report has been signed electronically. Number of Addenda: 0 Note Initiated On: 08/15/2023 9:50 AM Scope Withdrawal Time: 0 hours 10 minutes 1 second   Total Procedure Duration: 0 hours 13 minutes 32 seconds  Estimated Blood Loss:  Estimated blood loss: none.      [facility]

## 2023-08-16 ENCOUNTER — Encounter: Payer: Self-pay | Admitting: Gastroenterology

## 2023-09-02 ENCOUNTER — Ambulatory Visit: Admitting: Dermatology

## 2023-09-02 ENCOUNTER — Encounter: Payer: Self-pay | Admitting: Dermatology

## 2023-09-02 VITALS — BP 122/76 | HR 81

## 2023-09-02 DIAGNOSIS — L814 Other melanin hyperpigmentation: Secondary | ICD-10-CM | POA: Diagnosis not present

## 2023-09-02 DIAGNOSIS — L570 Actinic keratosis: Secondary | ICD-10-CM

## 2023-09-02 DIAGNOSIS — D229 Melanocytic nevi, unspecified: Secondary | ICD-10-CM

## 2023-09-02 DIAGNOSIS — W908XXA Exposure to other nonionizing radiation, initial encounter: Secondary | ICD-10-CM

## 2023-09-02 DIAGNOSIS — Z1283 Encounter for screening for malignant neoplasm of skin: Secondary | ICD-10-CM | POA: Diagnosis not present

## 2023-09-02 DIAGNOSIS — L821 Other seborrheic keratosis: Secondary | ICD-10-CM | POA: Diagnosis not present

## 2023-09-02 DIAGNOSIS — L578 Other skin changes due to chronic exposure to nonionizing radiation: Secondary | ICD-10-CM

## 2023-09-02 DIAGNOSIS — D1801 Hemangioma of skin and subcutaneous tissue: Secondary | ICD-10-CM | POA: Diagnosis not present

## 2023-09-02 NOTE — Patient Instructions (Addendum)
 Important Information  Due to recent changes in healthcare laws, you may see results of your pathology and/or laboratory studies on MyChart before the doctors have had a chance to review them. We understand that in some cases there may be results that are confusing or concerning to you. Please understand that not all results are received at the same time and often the doctors may need to interpret multiple results in order to provide you with the best plan of care or course of treatment. Therefore, we ask that you please give [place] 2 business days to thoroughly review all your results before contacting the office for clarification. Should we see a critical lab result, you will be contacted sooner.   If You Need Anything After Your Visit  If you have any questions or concerns for your doctor, please call our main line at [phone] If no one answers, please leave a voicemail as directed and we will return your call as soon as possible. Messages left after 4 pm will be answered the following business day.   You may also send [place] a message via MyChart. We typically respond to MyChart messages within 1-2 business days.  For prescription refills, please ask your pharmacy to contact our office. Our fax number is [phone].  If you have an urgent issue when the clinic is closed that cannot wait until the next business day, you can page your doctor at the number below.    Please note that while we do our best to be available for urgent issues outside of office hours, we are not available 24/7.   If you have an urgent issue and are unable to reach [place], you may choose to seek medical care at your doctor's office, retail clinic, urgent care center, or emergency room.  If you have a medical emergency, please immediately call 911 or go to the emergency department. In the event of inclement weather, please call our main line at [phone] for an update on the status of any delays or  closures.  Dermatology Medication Tips: Please keep the boxes that topical medications come in in order to help keep track of the instructions about where and how to use these. Pharmacies typically print the medication instructions only on the boxes and not directly on the medication tubes.   If your medication is too expensive, please contact our office at [phone] or send [place] a message through MyChart.   We are unable to tell what your co-pay for medications will be in advance as this is different depending on your insurance coverage. However, we may be able to find a substitute medication at lower cost or fill out paperwork to get insurance to cover a needed medication.   If a prior authorization is required to get your medication covered by your insurance company, please allow [place] 1-2 business days to complete this process.  Drug prices often vary depending on where the prescription is filled and some pharmacies may offer cheaper prices.  The website www.goodrx.com contains coupons for medications through different pharmacies. The prices here do not account for what the cost may be with help from insurance (it may be cheaper with your insurance), but the website can give you the price if you did not use any insurance.  - You can print the associated coupon and take it with your prescription to the pharmacy.  - You may also stop by our office during regular business hours and pick up a GoodRx coupon card.  - If  you need your prescription sent electronically to a different pharmacy, notify our office through [facility] or by phone at [phone]    Skin Education :   I counseled the patient regarding the following: Sun screen (SPF 30 or greater) should be applied during peak UV exposure (between 10am and 2pm) and reapplied after exercise or swimming.  The ABCDEs of melanoma were reviewed with the patient, and the importance of monthly self-examination of moles was emphasized.  Should any moles change in shape or color, or itch, bleed or burn, pt will contact our office for evaluation sooner then their interval appointment.  Plan: Sunscreen Recommendations I recommended a broad spectrum sunscreen with a SPF of 30 or higher. I explained that SPF 30 sunscreens block approximately 97 percent of the sun's harmful rays. Sunscreens should be applied at least 15 minutes prior to expected sun exposure and then every 2 hours after that as long as sun exposure continues. If swimming or exercising sunscreen should be reapplied every 45 minutes to an hour after getting wet or sweating. One ounce, or the equivalent of a shot glass full of sunscreen, is adequate to protect the skin not covered by a bathing suit. I also recommended a lip balm with a sunscreen as well. Sun protective clothing can be used in lieu of sunscreen but must be worn the entire time you are exposed to the sun's rays. For areas treated with Liquid Nitrogen:  Keep clean with soap and water.  Apply Vaseline or Aquaphor twice daily.

## 2023-09-02 NOTE — Progress Notes (Signed)
   New Patient Visit   Subjective  Angel Russell is a 73 y.o. female who presents for the following: Skin Cancer Screening and Full Body Skin Exam. No hx or family hx of skin cancer.  The patient presents for Total-Body Skin Exam (TBSE) for skin cancer screening and mole check. The patient has spots, moles and lesions to be evaluated, some may be new or changing.  The following portions of the chart were reviewed this encounter and updated as appropriate: medications, allergies, medical history  Review of Systems:  No other skin or systemic complaints except as noted in HPI or Assessment and Plan.  Objective  Well appearing patient in no apparent distress; mood and affect are within normal limits.  A full examination was performed including scalp, head, eyes, ears, nose, lips, neck, chest, axillae, abdomen, back, buttocks, bilateral upper extremities, bilateral lower extremities, hands, feet, fingers, toes, fingernails, and toenails. All findings within normal limits unless otherwise noted below.   Relevant physical exam findings are noted in the Assessment and Plan.  Left Buccal Cheek Erythematous thin papules/macules with gritty scale.   Assessment & Plan   SKIN CANCER SCREENING PERFORMED TODAY.  ACTINIC DAMAGE - Chronic condition, secondary to cumulative UV/sun exposure - diffuse scaly erythematous macules with underlying dyspigmentation - Recommend daily broad spectrum sunscreen SPF 30+ to sun-exposed areas, reapply every 2 hours as needed.  - Staying in the shade or wearing long sleeves, sun glasses (UVA+UVB protection) and wide brim hats (4-inch brim around the entire circumference of the hat) are also recommended for sun protection.  - Call for new or changing lesions.  LENTIGINES, SEBORRHEIC KERATOSES, HEMANGIOMAS - Benign normal skin lesions - Benign-appearing - Call for any changes  MELANOCYTIC NEVI - Tan-brown and/or pink-flesh-colored symmetric macules and  papules - Benign appearing on exam today - Observation - Call clinic for new or changing moles - Recommend daily use of broad spectrum spf 30+ sunscreen to sun-exposed areas.  AK (ACTINIC KERATOSIS) Left Buccal Cheek Destruction of lesion - Left Buccal Cheek Complexity: simple   Destruction method: cryotherapy   Informed consent: discussed and consent obtained   Timeout:  patient name, date of birth, surgical site, and procedure verified Lesion destroyed using liquid nitrogen: Yes   Region frozen until ice ball extended beyond lesion: Yes   Cryotherapy cycles:  2 Outcome: patient tolerated procedure well with no complications   Post-procedure details: wound care instructions given   ACTINIC SKIN DAMAGE   LENTIGINES   SEBORRHEIC KERATOSES   CHERRY ANGIOMA   MULTIPLE BENIGN NEVI    Return in about 1 year (around 09/01/2024) for 1-2 yr TBSC and 2 month AK f/u.  I, Haig Levan, Surg Tech III, am acting as scribe for Deneise Finlay, MD.   Documentation: I have reviewed the above documentation for accuracy and completeness, and I agree with the above.  Deneise Finlay, MD

## 2023-09-09 DIAGNOSIS — K0262 Dental caries on smooth surface penetrating into dentin: Secondary | ICD-10-CM | POA: Diagnosis not present

## 2023-10-29 DIAGNOSIS — Z0120 Encounter for dental examination and cleaning without abnormal findings: Secondary | ICD-10-CM | POA: Diagnosis not present

## 2023-11-04 ENCOUNTER — Ambulatory Visit: Admitting: Dermatology

## 2023-11-04 ENCOUNTER — Encounter: Payer: Self-pay | Admitting: Dermatology

## 2023-11-04 VITALS — BP 114/68 | HR 78

## 2023-11-04 DIAGNOSIS — L309 Dermatitis, unspecified: Secondary | ICD-10-CM | POA: Diagnosis not present

## 2023-11-04 DIAGNOSIS — L570 Actinic keratosis: Secondary | ICD-10-CM

## 2023-11-04 DIAGNOSIS — W57XXXA Bitten or stung by nonvenomous insect and other nonvenomous arthropods, initial encounter: Secondary | ICD-10-CM

## 2023-11-04 DIAGNOSIS — L918 Other hypertrophic disorders of the skin: Secondary | ICD-10-CM

## 2023-11-04 DIAGNOSIS — W57XXXD Bitten or stung by nonvenomous insect and other nonvenomous arthropods, subsequent encounter: Secondary | ICD-10-CM | POA: Diagnosis not present

## 2023-11-04 MED ORDER — TRIAMCINOLONE ACETONIDE 0.1 % EX OINT
1.0000 | TOPICAL_OINTMENT | Freq: Every day | CUTANEOUS | 0 refills | Status: AC | PRN
Start: 2023-11-04 — End: ?

## 2023-11-04 NOTE — Progress Notes (Signed)
   Follow-Up Visit   Subjective  Angel Russell is a 73 y.o. female who presents for the following: AKs Pt here to follow up on AK frozen off left cheek 09/02/23, since healed.   Frequent insect bites, no previous tx, itchy and bothersome on extremities  Scaling of palms, occurs every spring, itchy and bothersome, not previously treated.  [name] interpreter with patient.   The following portions of the chart were reviewed this encounter and updated as appropriate: medications, allergies, medical history  Review of Systems:  No other skin or systemic complaints except as noted in HPI or Assessment and Plan.  Objective  Well appearing patient in no apparent distress; mood and affect are within normal limits.  A focused examination was performed of the following areas: Left cheek Hands Face BUE BLE  Relevant exam findings are noted in the Assessment and Plan.    Assessment & Plan   HISTORY OF PRECANCEROUS ACTINIC KERATOSIS left cheek - site(s) of PreCancerous Actinic Keratosis clear today. - these may recur and new lesions may form requiring treatment to prevent transformation into skin cancer - observe for new or changing spots and contact [facility] for appointment if occur - photoprotection with sun protective clothing; sunglasses and broad spectrum sunscreen with SPF of at least 30 + and frequent self skin exams recommended - yearly exams by a dermatologist recommended for persons with history of PreCancerous Actinic Keratoses   Acrochordons (Skin Tags) on the back of neck - Fleshy, skin-colored pedunculated papules - Benign appearing.  - Observe. - If desired, they can be removed with an in office procedure that is not covered by insurance. - Please call the clinic if you notice any new or changing lesions.   Over the counter skin tag removal like dr scholls  HAND ECZEMA Exam: Scaly pink papules coalescing to plaques  Not at goal  Atopic dermatitis  (eczema) is a chronic, relapsing, pruritic condition that can significantly affect quality of life. It is often associated with allergic rhinitis and/or asthma and can require treatment with topical medications, phototherapy, or in severe cases biologic injectable medication (Dupixent; Adbry) or Oral JAK inhibitors.  Treatment Plan: Triamcinolone  0.1% ointment bid  Recommend gentle skin care.  Treatment Plan: Triamcinolone  oint to area bid for effected area prn  Arthropod Bites-  Recommend triamcinolone  0.1% BID to affected areas as needed   No follow-ups on file.  I, Darice Smock, CMA, am acting as scribe for RUFUS CHRISTELLA HOLY, MD.   Documentation: I have reviewed the above documentation for accuracy and completeness, and I agree with the above.  RUFUS CHRISTELLA HOLY, MD

## 2023-11-04 NOTE — Patient Instructions (Signed)

## 2024-04-14 ENCOUNTER — Other Ambulatory Visit (HOSPITAL_COMMUNITY): Payer: Self-pay | Admitting: Family Medicine

## 2024-04-14 DIAGNOSIS — Z1231 Encounter for screening mammogram for malignant neoplasm of breast: Secondary | ICD-10-CM

## 2024-04-17 ENCOUNTER — Other Ambulatory Visit: Payer: Self-pay | Admitting: Family Medicine

## 2024-04-17 ENCOUNTER — Other Ambulatory Visit: Payer: Self-pay | Admitting: Nurse Practitioner

## 2024-04-21 ENCOUNTER — Ambulatory Visit: Admitting: Physician Assistant

## 2024-04-29 ENCOUNTER — Encounter: Payer: Self-pay | Admitting: Physician Assistant

## 2024-04-29 ENCOUNTER — Ambulatory Visit: Admitting: Physician Assistant

## 2024-04-29 VITALS — BP 123/81 | HR 69

## 2024-04-29 DIAGNOSIS — D489 Neoplasm of uncertain behavior, unspecified: Secondary | ICD-10-CM | POA: Diagnosis not present

## 2024-04-29 DIAGNOSIS — Z872 Personal history of diseases of the skin and subcutaneous tissue: Secondary | ICD-10-CM | POA: Diagnosis not present

## 2024-04-29 DIAGNOSIS — L820 Inflamed seborrheic keratosis: Secondary | ICD-10-CM

## 2024-04-29 NOTE — Patient Instructions (Addendum)

## 2024-04-29 NOTE — Progress Notes (Signed)
° °  Follow-Up Visit   Subjective  Angel Russell is a 74 y.o. female ESTABLISHED PATIENT who presents for the following: AK follow up  Patient present today for follow up visit for AK follow up. Patient was last evaluated on 09/02/23. At this visit she had an AK treated with cryothreaphy. Patient reports sxs are better.   Other concern: new spot on mid forehead.   Angel Russell interpreter with patient.     The following portions of the chart were reviewed this encounter and updated as appropriate: medications, allergies, medical history  Review of Systems:  No other skin or systemic complaints except as noted in HPI or Assessment and Plan.  Objective  Well appearing patient in no apparent distress; mood and affect are within normal limits.    A focused examination was performed of the following areas: Face  Relevant exam findings are noted in the Assessment and Plan.  Mid Forehead 0.7 CM PINK SCALY PAPULE    Assessment & Plan     HISTORY OF PRECANCEROUS ACTINIC KERATOSIS ---RESOLVED   - site(s) of PreCancerous Actinic Keratosis clear today. - these may recur and new lesions may form requiring treatment to prevent transformation into skin cancer - observe for new or changing spots and contact [facility] for appointment if occur - photoprotection with sun protective clothing; sunglasses and broad spectrum sunscreen with SPF of at least 30 + and frequent self skin exams recommended - yearly exams by a dermatologist recommended for persons with history of PreCancerous Actinic Keratoses     NEOPLASM OF UNCERTAIN BEHAVIOR Mid Forehead - Skin / nail biopsy Type of biopsy: tangential   Informed consent: discussed and consent obtained   Timeout: patient name, date of birth, surgical site, and procedure verified   Procedure prep:  Patient was prepped and draped in usual sterile fashion Prep type:  Isopropyl alcohol Anesthesia: the lesion was anesthetized in a standard  fashion   Anesthetic:  1% lidocaine  w/ epinephrine  1-100,000 buffered w/ 8.4% NaHCO3 Instrument used: flexible razor blade   Hemostasis achieved with: pressure, aluminum chloride and electrodesiccation   Outcome: patient tolerated procedure well   Post-procedure details: sterile dressing applied and wound care instructions given   Dressing type: bandage and petrolatum    Specimen 1 - Surgical pathology Differential Diagnosis: SCC VS AK   Check Margins: No HISTORY OF ACTINIC KERATOSES    Return in about 4 months (around 08/28/2024) for 4 MO TBSE .  I, Doyce Pan, CMA, am acting as scribe for Elianah Karis K, PA-C.   Documentation: I have reviewed the above documentation for accuracy and completeness, and I agree with the above.  Edlin Ford K, PA-C

## 2024-05-01 LAB — SURGICAL PATHOLOGY

## 2024-05-03 ENCOUNTER — Ambulatory Visit: Payer: Self-pay | Admitting: Physician Assistant

## 2024-05-13 ENCOUNTER — Ambulatory Visit (HOSPITAL_COMMUNITY)
Admission: RE | Admit: 2024-05-13 | Discharge: 2024-05-13 | Disposition: A | Discharge status: Home / Self Care | Source: Ambulatory Visit | Attending: Family Medicine | Admitting: Family Medicine

## 2024-05-13 ENCOUNTER — Encounter (HOSPITAL_COMMUNITY): Payer: Self-pay

## 2024-05-13 DIAGNOSIS — Z1231 Encounter for screening mammogram for malignant neoplasm of breast: Secondary | ICD-10-CM | POA: Insufficient documentation

## 2024-05-18 ENCOUNTER — Ambulatory Visit (INDEPENDENT_AMBULATORY_CARE_PROVIDER_SITE_OTHER): Admitting: Nurse Practitioner

## 2024-05-18 ENCOUNTER — Encounter: Payer: Self-pay | Admitting: Nurse Practitioner

## 2024-05-18 VITALS — BP 134/80 | HR 72 | Temp 97.5°F | Ht 66.0 in | Wt 211.3 lb

## 2024-05-18 DIAGNOSIS — M150 Primary generalized (osteo)arthritis: Secondary | ICD-10-CM | POA: Diagnosis not present

## 2024-05-18 DIAGNOSIS — E785 Hyperlipidemia, unspecified: Secondary | ICD-10-CM

## 2024-05-18 DIAGNOSIS — Z01411 Encounter for gynecological examination (general) (routine) with abnormal findings: Secondary | ICD-10-CM

## 2024-05-18 DIAGNOSIS — E039 Hypothyroidism, unspecified: Secondary | ICD-10-CM | POA: Diagnosis not present

## 2024-05-18 DIAGNOSIS — R5383 Other fatigue: Secondary | ICD-10-CM | POA: Diagnosis not present

## 2024-05-18 DIAGNOSIS — Z01419 Encounter for gynecological examination (general) (routine) without abnormal findings: Secondary | ICD-10-CM

## 2024-05-19 ENCOUNTER — Encounter: Payer: Self-pay | Admitting: Nurse Practitioner

## 2024-05-20 MED ORDER — NAPROXEN 500 MG PO TABS: 500.0000 mg | ORAL_TABLET | Freq: Two times a day (BID) | ORAL | 2 refills | Status: AC | PRN

## 2024-05-20 NOTE — Progress Notes (Signed)
 "  Subjective:    Patient ID: Angel Russell, female    DOB: [birth year], 74 y.o.   MRN: [id number]  HPI Discussed the use of AI scribe software for clinical note transcription with the patient, who gave verbal consent to proceed.  History of Present Illness Angel Russell is a 74 year old female who presents for an annual physical exam. Interpretor is present per her request.   She has experienced slight weight gain. She is compliant with her cholesterol and thyroid  medications and has received her flu shot. She uses naproxen  as needed for pain management and has a few pills left. Takes occasionally for arthritis pain, not everyday.   She maintains an active lifestyle, walking her dog daily and swimming. She prepares home-cooked meals and occasionally consumes wine. Her last eye exam was a year ago, and she has upcoming dental appointments.  She experiences occasional itching in the private area in the evenings, which she manages with lotion. No bleeding, rash, lesions or discharge is noted. History of ABH with BSO. She has mucus production at night, requiring throat clearing, and reports occasional numbness and swelling in one finger due to arthritis changes.  No sore throat, chest pain, or shortness of breath, except occasionally. She reports occasional constipation which she manages. She had a recent mammogram. Bone density 07/08/23. Same female partner.      05/18/2024    1:49 PM  Depression screen PHQ 2/9  Decreased Interest 0  Down, Depressed, Hopeless 0  PHQ - 2 Score 0  Altered sleeping 0  Tired, decreased energy 0  Change in appetite 0  Feeling bad or failure about yourself  0  Trouble concentrating 0  Moving slowly or fidgety/restless 0  Suicidal thoughts 0  PHQ-9 Score 0  Difficult doing work/chores Not difficult at all      05/18/2024    1:49 PM 05/18/2024    1:14 PM  GAD 7 : Generalized Anxiety Score  Nervous, Anxious, on Edge 0 0  Control/stop worrying 0 0  Worry too  much - different things 0 0  Trouble relaxing 0 0  Restless 0 0  Easily annoyed or irritable 0 0  Afraid - awful might happen 0 0  Total GAD 7 Score 0 0  Anxiety Difficulty Not difficult at all Not difficult at all    Social History[1]   Review of Systems  Constitutional:  Positive for fatigue. Negative for activity change and appetite change.  HENT:  Positive for congestion. Negative for sore throat and trouble swallowing.   Respiratory:  Negative for cough, chest tightness, shortness of breath and wheezing.   Cardiovascular:  Negative for chest pain.  Gastrointestinal:  Positive for constipation. Negative for abdominal distention, abdominal pain, blood in stool, diarrhea, nausea and vomiting.  Genitourinary:  Negative for difficulty urinating, dysuria, frequency, genital sores, pelvic pain, urgency, vaginal bleeding and vaginal discharge.       Objective:   Physical Exam Vitals and nursing note reviewed. Exam conducted with a chaperone present.  Constitutional:      General: She is not in acute distress.    Appearance: She is well-developed.  Neck:     Thyroid : No thyromegaly.     Trachea: No tracheal deviation.     Comments: Thyroid  non tender to palpation. No mass or goiter noted.  Cardiovascular:     Rate and Rhythm: Normal rate and regular rhythm.     Heart sounds: Normal heart sounds. No murmur heard. Pulmonary:  Effort: Pulmonary effort is normal.     Breath sounds: Normal breath sounds.  Chest:  Breasts:    Right: No swelling, inverted nipple, mass, skin change or tenderness.     Left: No swelling, inverted nipple, mass, skin change or tenderness.  Abdominal:     General: There is no distension.     Palpations: Abdomen is soft.     Tenderness: There is no abdominal tenderness.  Genitourinary:    Comments: Defers GU exam. Denies any problems.  Musculoskeletal:     Cervical back: Normal range of motion and neck supple.  Lymphadenopathy:     Cervical: No  cervical adenopathy.     Upper Body:     Right upper body: No supraclavicular, axillary or pectoral adenopathy.     Left upper body: No supraclavicular, axillary or pectoral adenopathy.  Skin:    General: Skin is warm and dry.  Neurological:     Mental Status: She is alert and oriented to person, place, and time.  Psychiatric:        Mood and Affect: Mood normal.        Behavior: Behavior normal.        Thought Content: Thought content normal.    Today's Vitals   05/18/24 1348  BP: 134/80  Pulse: 72  Temp: (!) 97.5 F (36.4 C)  SpO2: 97%  Weight: 211 lb 5 oz (95.9 kg)  Height: 5' 6 (1.676 m)   Body mass index is 34.11 kg/m.       Assessment & Plan:  1. Well woman exam (Primary) Flu shot received. Regular physical activity maintained. Mammogram and Bone Density up to date.  Consider shingles vaccine at local pharmacy.   2. Hypothyroidism, unspecified type  - TSH + free T4  3. Hyperlipidemia, unspecified hyperlipidemia type - Continue Rosuvastatin  10 mg oral daily. - Lipid panel  4. Fatigue, unspecified type  - CBC with Differential/Platelet - Comprehensive metabolic panel with GFR  5. Primary osteoarthritis involving multiple joints Osteoarthritis with arthritis changes in fingers. Pain managed with Naproxen . Kidney function normal. - Continue Naproxen  500 mg oral bid as needed for pain.  - naproxen  (NAPROSYN ) 500 MG tablet; Take 1 tablet (500 mg total) by mouth 2 (two) times daily as needed for moderate pain (pain score 4-6). Prn pain  Dispense: 30 tablet; Refill: 2   Return in about 1 year (around 05/18/2025) for physical.        [1]  Social History Tobacco Use   Smoking status: Never   Smokeless tobacco: Never  Vaping Use   Vaping status: Never Used  Substance Use Topics   Alcohol use: No    Alcohol/week: 0.0 standard drinks of alcohol    Comment: 1-2 times per month   Drug use: No   "

## 2024-05-25 ENCOUNTER — Other Ambulatory Visit: Payer: Self-pay | Admitting: Family Medicine

## 2024-05-25 DIAGNOSIS — E039 Hypothyroidism, unspecified: Secondary | ICD-10-CM

## 2024-06-02 LAB — LIPID PANEL
Chol/HDL Ratio: 1.9 ratio (ref 0.0–4.4)
Cholesterol, Total: 145 mg/dL (ref 100–199)
HDL: 75 mg/dL
LDL Chol Calc (NIH): 54 mg/dL (ref 0–99)
Triglycerides: 83 mg/dL (ref 0–149)
VLDL Cholesterol Cal: 16 mg/dL (ref 5–40)

## 2024-06-02 LAB — CBC WITH DIFFERENTIAL/PLATELET
Basophils Absolute: 0 x10E3/uL (ref 0.0–0.2)
Basos: 1 %
EOS (ABSOLUTE): 0.1 x10E3/uL (ref 0.0–0.4)
Eos: 3 %
Hematocrit: 42.7 % (ref 34.0–46.6)
Hemoglobin: 13.8 g/dL (ref 11.1–15.9)
Immature Grans (Abs): 0 x10E3/uL (ref 0.0–0.1)
Immature Granulocytes: 0 %
Lymphocytes Absolute: 1.2 x10E3/uL (ref 0.7–3.1)
Lymphs: 28 %
MCH: 29.7 pg (ref 26.6–33.0)
MCHC: 32.3 g/dL (ref 31.5–35.7)
MCV: 92 fL (ref 79–97)
Monocytes Absolute: 0.4 x10E3/uL (ref 0.1–0.9)
Monocytes: 8 %
Neutrophils Absolute: 2.6 x10E3/uL (ref 1.4–7.0)
Neutrophils: 60 %
Platelets: 254 x10E3/uL (ref 150–450)
RBC: 4.65 x10E6/uL (ref 3.77–5.28)
RDW: 12.9 % (ref 11.7–15.4)
WBC: 4.3 x10E3/uL (ref 3.4–10.8)

## 2024-06-02 LAB — COMPREHENSIVE METABOLIC PANEL WITH GFR
ALT: 16 IU/L (ref 0–32)
AST: 17 IU/L (ref 0–40)
Albumin: 4.2 g/dL (ref 3.8–4.8)
Alkaline Phosphatase: 97 IU/L (ref 49–135)
BUN/Creatinine Ratio: 18 (ref 12–28)
BUN: 15 mg/dL (ref 8–27)
Bilirubin Total: 0.4 mg/dL (ref 0.0–1.2)
CO2: 23 mmol/L (ref 20–29)
Calcium: 9.5 mg/dL (ref 8.7–10.3)
Chloride: 108 mmol/L — ABNORMAL HIGH (ref 96–106)
Creatinine, Ser: 0.82 mg/dL (ref 0.57–1.00)
Globulin, Total: 2.6 g/dL (ref 1.5–4.5)
Glucose: 85 mg/dL (ref 70–99)
Potassium: 4.5 mmol/L (ref 3.5–5.2)
Sodium: 143 mmol/L (ref 134–144)
Total Protein: 6.8 g/dL (ref 6.0–8.5)
eGFR: 75 mL/min/1.73

## 2024-06-02 LAB — TSH+FREE T4
Free T4: 1.27 ng/dL (ref 0.82–1.77)
TSH: 3.15 u[IU]/mL (ref 0.450–4.500)

## 2024-06-03 ENCOUNTER — Ambulatory Visit: Payer: Self-pay | Admitting: Nurse Practitioner

## 2024-06-09 ENCOUNTER — Ambulatory Visit: Admitting: Dermatology

## 2024-08-31 ENCOUNTER — Ambulatory Visit: Admitting: Physician Assistant

## 2024-09-01 ENCOUNTER — Ambulatory Visit: Admitting: Dermatology
